# Patient Record
Sex: Male | Born: 1985 | Race: White | Hispanic: No | Marital: Married | Smoking: Current every day smoker
Health system: Southern US, Community
[De-identification: ages and names within clinical notes are randomized; demographics above are authoritative.]

## PROBLEM LIST (undated history)

## (undated) DIAGNOSIS — F909 Attention-deficit hyperactivity disorder, unspecified type: Secondary | ICD-10-CM

## (undated) HISTORY — DX: Attention-deficit hyperactivity disorder, unspecified type: F90.9

---
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
## 2005-01-09 ENCOUNTER — Ambulatory Visit: Payer: Self-pay | Admitting: Pediatrics

## 2005-04-26 ENCOUNTER — Emergency Department: Payer: Self-pay | Admitting: Emergency Medicine

## 2005-04-29 ENCOUNTER — Emergency Department: Payer: Self-pay | Admitting: Emergency Medicine

## 2005-05-16 ENCOUNTER — Emergency Department: Payer: Self-pay | Admitting: Emergency Medicine

## 2005-06-08 ENCOUNTER — Ambulatory Visit: Payer: Self-pay | Admitting: Pediatrics

## 2005-10-21 ENCOUNTER — Emergency Department: Payer: Self-pay | Admitting: Emergency Medicine

## 2006-02-20 ENCOUNTER — Emergency Department: Payer: Self-pay | Admitting: Unknown Physician Specialty

## 2006-02-23 ENCOUNTER — Emergency Department: Payer: Self-pay

## 2006-02-27 ENCOUNTER — Emergency Department: Payer: Self-pay | Admitting: Emergency Medicine

## 2006-04-14 ENCOUNTER — Emergency Department: Payer: Self-pay | Admitting: Emergency Medicine

## 2006-04-15 ENCOUNTER — Other Ambulatory Visit: Payer: Self-pay

## 2006-12-09 ENCOUNTER — Emergency Department: Payer: Self-pay | Admitting: Emergency Medicine

## 2007-01-16 ENCOUNTER — Emergency Department: Payer: Self-pay | Admitting: Emergency Medicine

## 2008-12-28 ENCOUNTER — Emergency Department: Payer: Self-pay | Admitting: Emergency Medicine

## 2009-07-18 ENCOUNTER — Emergency Department: Payer: Self-pay | Admitting: Emergency Medicine

## 2012-11-24 ENCOUNTER — Emergency Department: Payer: Self-pay | Admitting: Emergency Medicine

## 2012-11-24 LAB — BASIC METABOLIC PANEL
Anion Gap: 4 — ABNORMAL LOW (ref 7–16)
BUN: 8 mg/dL (ref 7–18)
EGFR (African American): >60
EGFR (Non-African Amer.): >60
Glucose: 81 mg/dL (ref 65–99)
Osmolality: 275 (ref 275–301)
Potassium: 3.8 mmol/L (ref 3.5–5.1)
Sodium: 139 mmol/L (ref 136–145)

## 2012-11-24 LAB — TROPONIN I: Troponin-I: <0.02 ng/mL

## 2012-11-24 LAB — URINALYSIS, COMPLETE
Hyaline Cast: 1
Leukocyte Esterase: NEGATIVE
Nitrite: NEGATIVE
Specific Gravity: 1.016 (ref 1.003–1.030)
Squamous Epithelial: NONE SEEN
WBC UR: <1 /HPF (ref 0–5)

## 2012-11-24 LAB — CBC
HCT: 47.1 % (ref 40.0–52.0)
HGB: 16 g/dL (ref 13.0–18.0)
MCH: 30.7 pg (ref 26.0–34.0)
Platelet: 159 10*3/uL (ref 150–440)
RBC: 5.22 10*6/uL (ref 4.40–5.90)
RDW: 13.2 % (ref 11.5–14.5)

## 2014-07-01 ENCOUNTER — Emergency Department: Payer: Self-pay | Admitting: Emergency Medicine

## 2014-07-01 LAB — CBC
HCT: 43.9 % (ref 40.0–52.0)
HGB: 14.8 g/dL (ref 13.0–18.0)
MCH: 31 pg (ref 26.0–34.0)
MCHC: 33.7 g/dL (ref 32.0–36.0)
MCV: 92 fL (ref 80–100)
PLATELETS: 146 10*3/uL — AB (ref 150–440)
RBC: 4.77 10*6/uL (ref 4.40–5.90)
RDW: 12.9 % (ref 11.5–14.5)
WBC: 7.7 10*3/uL (ref 3.8–10.6)

## 2014-07-01 LAB — TROPONIN I: Troponin-I: <0.02 ng/mL

## 2014-07-01 LAB — BASIC METABOLIC PANEL
Anion Gap: 8 (ref 7–16)
BUN: 6 mg/dL — AB (ref 7–18)
Calcium, Total: 8.7 mg/dL (ref 8.5–10.1)
Chloride: 106 mmol/L (ref 98–107)
Co2: 25 mmol/L (ref 21–32)
Creatinine: 0.8 mg/dL (ref 0.60–1.30)
EGFR (African American): >60
EGFR (Non-African Amer.): >60
Glucose: 97 mg/dL (ref 65–99)
Osmolality: 275 (ref 275–301)
Potassium: 3.7 mmol/L (ref 3.5–5.1)
Sodium: 139 mmol/L (ref 136–145)

## 2015-01-22 ENCOUNTER — Encounter: Payer: Self-pay | Admitting: Emergency Medicine

## 2015-01-22 ENCOUNTER — Emergency Department
Admission: EM | Admit: 2015-01-22 | Discharge: 2015-01-22 | Disposition: A | Discharge status: Home / Self Care | Payer: Self-pay | Attending: Emergency Medicine | Admitting: Emergency Medicine

## 2015-01-22 DIAGNOSIS — L551 Sunburn of second degree: Secondary | ICD-10-CM | POA: Insufficient documentation

## 2015-01-22 DIAGNOSIS — L559 Sunburn, unspecified: Secondary | ICD-10-CM

## 2015-01-22 DIAGNOSIS — Z720 Tobacco use: Secondary | ICD-10-CM | POA: Insufficient documentation

## 2015-01-22 MED ORDER — HYDROXYZINE HCL 25 MG PO TABS
25.0000 mg | ORAL_TABLET | Freq: Once | ORAL | Status: AC
  Administered 2015-01-22: 25 mg via ORAL

## 2015-01-22 MED ORDER — IBUPROFEN 800 MG PO TABS: 800.0000 mg | ORAL_TABLET | Freq: Three times a day (TID) | ORAL | Status: DC | PRN

## 2015-01-22 MED ORDER — SILVER SULFADIAZINE 1 % EX CREA: TOPICAL_CREAM | CUTANEOUS | Status: AC

## 2015-01-22 MED ORDER — HYDROXYZINE HCL 25 MG PO TABS
ORAL_TABLET | ORAL | Status: AC
  Filled 2015-01-22: qty 1

## 2015-01-22 MED ORDER — HYDROXYZINE HCL 25 MG PO TABS: 25.0000 mg | ORAL_TABLET | Freq: Three times a day (TID) | ORAL | Status: DC | PRN

## 2015-01-22 NOTE — ED Notes (Signed)
Pt presents to ED with sunburn to his ankles from being outside on Sunday. 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 Redness noted to both lower legs and ankles.

## 2015-01-22 NOTE — Discharge Instructions (Signed)
Take medication as prescribed. Use cold compresses as discussed to help with pain and discomfort, and have a barrier between compresses and skin such as a towel. Use mild soap and good moisturizing lotion. Always use sunscreen. Avoid direct sun exposure as able. Follow-up with her primary care physician or the above as needed.  Return to the ER for fever, vomiting, persistent nausea, new or worsening concerns.  Sunburn Sunburn is damage to the skin caused by overexposure to ultraviolet (UV) rays. People with light skin or a fair complexion may be more susceptible to sunburn. Repeated sun exposure causes early skin aging such as wrinkles and sun spots. It also increases the risk of skin cancer. CAUSES A sunburn is caused by getting too much UV radiation from the sun. SYMPTOMS  Red or pink skin.  Soreness and swelling.  Pain.  Blisters.  Peeling skin.  Headache, fever, and fatigue if sunburn covers a large area. TREATMENT  Your caregiver may tell you to take certain medicines to lessen inflammation.  Your caregiver may have you use hydrocortisone cream or spray to help with itching and inflammation.  Your caregiver may prescribe an antibiotic cream to use on blisters. HOME CARE INSTRUCTIONS   Avoid further exposure to the sun.  Cool baths and cool compresses may be helpful if used several times per day. Do not apply ice, since this may result in more damage to the skin.  Only take over-the-counter or prescription medicines for pain, discomfort, or fever as directed by your caregiver.  Use aloe or other over-the-counter sunburn creams or gels on your skin. Do not apply these creams or gels on blisters.  Drink enough fluids to keep your urine clear or pale yellow.  Do not break blisters. If blisters break, your caregiver may recommend an antibiotic cream to apply to the affected area. PREVENTION   Try to avoid the sun between 10:00 a.m. and 4:00 p.m. when it is the  strongest.  Apply sunscreen at least 30 minutes before exposure to the sun.  Always wear protective hats, clothing, and sunglasses with UV protection.  Avoid medicines, herbs, and foods that increase your sensitivity to sunlight.  Avoid tanning beds. SEEK IMMEDIATE MEDICAL CARE IF:   You have a fever.  Your pain is uncontrolled with medicine.  You start to vomit or have diarrhea.  You feel faint or develop a headache with confusion.  You develop severe blistering.  You have a pus-like (purulent) discharge coming from the blisters.  Your burn becomes more painful and swollen. MAKE SURE YOU:  Understand these instructions.  Will watch your condition.  Will get help right away if you are not doing well or get worse. Document Released: 04/29/2005 Document Revised: 11/14/2012 Document Reviewed: 01/11/2011 [facility] Patient Information 2015 [place], [place]. This information is not intended to replace advice given to you by your health care provider. Make sure you discuss any questions you have with your health care provider.

## 2015-01-22 NOTE — ED Provider Notes (Signed)
[facility] Emergency Department Provider Note  ____________________________________________  Time seen: Approximately 11:45 PM  I have reviewed the triage vital signs and the nursing notes.   HISTORY  Chief Complaint Sunburn   HPI Steven Berry is a 29 y.o. male presents to the ER for complaint of sunburn. Patient states that Sunday afternoon he laid outside in swim trunks "to get a tan" for approximate 2 hours. Patient states that that evening his skin felt warm and noticed that it was red. Patient states that the redness is only to the front of his body. Patient states that he is here tonight as the sunburn is very tender and states that it hurts on his feet to have to wear shoes. Patient states that he has to wear shoes while at work as he works at a 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  Denies nausea, vomiting, diarrhea, weakness, fever, decreased appetite or decreased by mouth intake. Reports continues to eat and drink well. States pain is a burning pain and rates it at 4 out of 10 to chest and lower legs. Denies other complaints. Denies other changes and foods, medicines, cracks, lotions, detergents or other chemicals.States skin itches where it is red.  Patient states that face and arms did not get burned while on this line as he is frequently exposed to sun to those areas, however states that his chest and legs are not normally exposed to the sun.   History reviewed. No pertinent past medical history.  There are no active problems to display for this patient.   History reviewed. No pertinent past surgical history.  No current outpatient prescriptions on file.  Allergies Review of patient's allergies indicates no known allergies.  No family history on file.  Social History History  Substance Use Topics  . Smoking status: Current Every Day Smoker -- 1.00 packs/day    Types:  Cigarettes  . Smokeless tobacco: Never Used  . Alcohol Use: Yes    Review of Systems Constitutional: No fever/chills Eyes: No visual changes. ENT: No sore throat. Cardiovascular: Denies chest pain. Respiratory: Denies shortness of breath. Gastrointestinal: No abdominal pain.  No nausea, no vomiting.  No diarrhea.  No constipation. Genitourinary: Negative for dysuria. Musculoskeletal: Negative for back pain. Skin: positive for redness and itching Neurological: Negative for headaches, focal weakness or numbness.  10-point ROS otherwise negative.  ____________________________________________   PHYSICAL EXAM:  VITAL SIGNS: ED Triage Vitals  Enc Vitals Group     BP 01/22/15 2252 130/73 mmHg     Pulse Rate 01/22/15 2252 84     Resp 01/22/15 2252 20     Temp 01/22/15 2254 97.8 F (36.6 C)     Temp Source 01/22/15 2252 Oral     SpO2 01/22/15 2252 100 %     Weight 01/22/15 2252 165 lb (74.844 kg)     Height 01/22/15 2252 6' (1.829 m)     Head Cir --      Peak Flow --      Pain Score 01/22/15 2252 8     Pain Loc --      Pain Edu? --      Excl. in GC? --     Constitutional: Alert and oriented. Well appearing and in no acute distress. Eyes: Conjunctivae are normal. PERRL. EOMI. Head: Atraumatic. Nose: No congestion/rhinnorhea. Mouth/Throat: Mucous membranes are moist.  Oropharynx non-erythematous. Neck: No stridor.  No cervical spine tenderness  to palpation. Hematological/Lymphatic/Immunilogical: No cervical lymphadenopathy. Cardiovascular: Normal rate, regular rhythm. Grossly normal heart sounds.  Good peripheral circulation. Respiratory: Normal respiratory effort.  No retractions. Lungs CTAB. Gastrointestinal: Soft and nontender. No distention. No abdominal bruits. No CVA tenderness. Musculoskeletal: No lower extremity tenderness nor edema.  No joint effusions. Neurologic:  Normal speech and language. No gross focal neurologic deficits are appreciated. Speech is normal.  No gait instability. Skin:  Skin is warm, dry and intact.   Pruritic erythema to chest, bilateral lower extremities and dorsal feet with some dryness and scaling.  Erythema is blanchable throughout. Mild pruritis and minimal erythema to bilateral arms. No induration or fluctuance. Erythema only to anterior body surface. Erythema present from clothing waist line up chest, as well as marked skin coloration difference on upper thighs consistent with shorts line: with erythema below mid thigh and none above where shorts were present. Erythema pattern spares where clothing was present.   Bilateral dorsal feet mild to mod TTP and each with one less than 0.5cm small fluid filled blister present. No pitting edema. Bilateral pedal pulses equal and easily found. Steady gait. No tenderness to palpation above ankles. Changes positions quickly without distress. Skin otherwise intact. Psychiatric: Mood and affect are normal. Speech and behavior are normal.  ____________________________________________   LABS (all labs ordered are listed, but only abnormal results are displayed)  Labs Reviewed - No data to display ____________________________________________   INITIAL IMPRESSION / ASSESSMENT AND PLAN / ED COURSE  Pertinent labs & imaging results that were available during my care of the patient were reviewed by me and considered in my medical decision making (see chart for details).  No acute distress. Very well-appearing patient. Presents the ER for the complaint of sunburn. Patient states that he laid out in the sun for approximately 2 hours on Sunday which caused the sunburn to appear within a few hours. Patient states that it is very tender to his feet and presents tonight as he needs a work note because it hurts to wear shoes at work at night.   Patient with generalized erythema to anterior body which spares areas where clothing was present consistent with sunburn. Bilateral dorsal feet with less than  0.5 cm intact blister with tenderness. Erythema blanchable. Patient well-appearing. He continues to eat and drink well per patient. Moist mucous membranes. Will treat patient with hydroxyzine as needed for itching, ibuprofen anti-inflammatory as well as Silvadene topical cream to be applied to bilateral ankles and feet. Discussed use of sun protection as well as sunscreen. Discussed hydration and skin moisturizing. Patient verbalized understanding. Discussed follow-up and return parameters. ____________________________________________   FINAL CLINICAL IMPRESSION(S) / ED DIAGNOSES  Final diagnoses:  Burn from the sun      Renford Dills, NP 01/23/15 0013  Myrna Blazer, MD 01/29/15 [phone]

## 2015-01-22 NOTE — ED Notes (Signed)
Patient with bilateral leg sunburn Sunday. Patient with some swelling to bilateral ankles.

## 2015-11-25 ENCOUNTER — Emergency Department
Admission: EM | Admit: 2015-11-25 | Discharge: 2015-11-25 | Disposition: A | Discharge status: Home / Self Care | Payer: Managed Care, Other (non HMO) | Attending: Emergency Medicine | Admitting: Emergency Medicine

## 2015-11-25 DIAGNOSIS — Z79899 Other long term (current) drug therapy: Secondary | ICD-10-CM | POA: Insufficient documentation

## 2015-11-25 DIAGNOSIS — Z791 Long term (current) use of non-steroidal anti-inflammatories (NSAID): Secondary | ICD-10-CM | POA: Diagnosis not present

## 2015-11-25 DIAGNOSIS — M546 Pain in thoracic spine: Secondary | ICD-10-CM | POA: Diagnosis present

## 2015-11-25 DIAGNOSIS — F17210 Nicotine dependence, cigarettes, uncomplicated: Secondary | ICD-10-CM | POA: Diagnosis not present

## 2015-11-25 DIAGNOSIS — M62830 Muscle spasm of back: Secondary | ICD-10-CM | POA: Diagnosis not present

## 2015-11-25 MED ORDER — DIAZEPAM 5 MG/ML IJ SOLN
INTRAMUSCULAR | Status: AC
  Filled 2015-11-25: qty 2

## 2015-11-25 MED ORDER — TRAMADOL HCL 50 MG PO TABS: 50.0000 mg | ORAL_TABLET | Freq: Four times a day (QID) | ORAL | Status: DC | PRN

## 2015-11-25 MED ORDER — DIAZEPAM 5 MG PO TABS: 5.0000 mg | ORAL_TABLET | Freq: Three times a day (TID) | ORAL | Status: DC | PRN

## 2015-11-25 MED ORDER — DIAZEPAM 5 MG/ML IJ SOLN
5.0000 mg | Freq: Once | INTRAMUSCULAR | Status: AC
  Administered 2015-11-25: 5 mg via INTRAMUSCULAR

## 2015-11-25 MED ORDER — KETOROLAC TROMETHAMINE 30 MG/ML IJ SOLN: 30.0000 mg | Freq: Once | INTRAMUSCULAR | Status: DC

## 2015-11-25 MED ORDER — KETOROLAC TROMETHAMINE 30 MG/ML IJ SOLN
INTRAMUSCULAR | Status: AC
  Filled 2015-11-25: qty 1

## 2015-11-25 MED ORDER — KETOROLAC TROMETHAMINE 30 MG/ML IJ SOLN
30.0000 mg | Freq: Once | INTRAMUSCULAR | Status: AC
  Administered 2015-11-25: 30 mg via INTRAMUSCULAR

## 2015-11-25 NOTE — ED Provider Notes (Signed)
CSN: [id number]     Arrival date & time 11/25/15  1731 History   First MD Initiated Contact with Patient 11/25/15 1750     Chief Complaint  Patient presents with  . Back Pain     (Consider location/radiation/quality/duration/timing/severity/associated sxs/prior Treatment) HPI  30 year old male presents to emergency department tonight for evaluation of mid left-sided thoracic back pain. 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 This morning noticed tightness and spasming in the left thoracic spine. Spasming is been persistent since and causing moderate 8 out of 10 pain. Isn't taking medications for pain relief. Denies any numbness or tingling. No trauma. Pain does not radiate. He denies any abdominal pain, chest pain, urinary symptoms.  History reviewed. No pertinent past medical history. History reviewed. No pertinent past surgical history. No family history on file. Social History  Substance Use Topics  . Smoking status: Current Every Day Smoker -- 1.00 packs/day    Types: Cigarettes  . Smokeless tobacco: Never Used  . Alcohol Use: Yes    Review of Systems  Constitutional: Negative.  Negative for fever, chills, activity change and appetite change.  HENT: Negative for congestion, ear pain, mouth sores, rhinorrhea, sinus pressure, sore throat and trouble swallowing.   Eyes: Negative for photophobia, pain and discharge.  Respiratory: Negative for cough, chest tightness and shortness of breath.   Cardiovascular: Negative for chest pain and leg swelling.  Gastrointestinal: Negative for nausea, vomiting, abdominal pain, diarrhea and abdominal distention.  Genitourinary: Negative for dysuria and difficulty urinating.  Musculoskeletal: Positive for myalgias. Negative for back pain, arthralgias and gait problem.  Skin: Negative for color change and rash.  Neurological: Negative for dizziness and headaches.  Hematological: Negative for adenopathy.  Psychiatric/Behavioral:  Negative for behavioral problems and agitation.      Allergies  Penicillins  Home Medications   Prior to Admission medications   Medication Sig Start Date End Date Taking? Authorizing Provider  diazepam (VALIUM) 5 MG tablet Take 1 tablet (5 mg total) by mouth every 8 (eight) hours as needed for muscle spasms. 11/25/15 11/24/16  Evon Slack, PA-C  hydrOXYzine (ATARAX/VISTARIL) 25 MG tablet Take 1 tablet (25 mg total) by mouth 3 (three) times daily as needed for itching. 01/22/15   Renford Dills, NP  ibuprofen (ADVIL,MOTRIN) 800 MG tablet Take 1 tablet (800 mg total) by mouth every 8 (eight) hours as needed for mild pain or moderate pain. 01/22/15   Renford Dills, NP  silver sulfADIAZINE (SILVADENE) 1 % cream Apply to lower legs and feet twice a day for 10 days 01/22/15 01/22/16  Renford Dills, NP  traMADol (ULTRAM) 50 MG tablet Take 1 tablet (50 mg total) by mouth every 6 (six) hours as needed. 11/25/15   Evon Slack, PA-C   BP 130/52 mmHg  Pulse 76  Temp(Src) 98.2 F (36.8 C) (Oral)  Resp 18  Ht 6' (1.829 m)  Wt 74.844 kg  BMI 22.37 kg/m2  SpO2 98% Physical Exam  Constitutional: He is oriented to person, place, and time. He appears well-developed and well-nourished.  HENT:  Head: Normocephalic and atraumatic.  Eyes: Conjunctivae and EOM are normal. Pupils are equal, round, and reactive to light.  Neck: Normal range of motion. Neck supple.  Cardiovascular: Normal rate, regular rhythm, normal heart sounds and intact distal pulses.   Pulmonary/Chest: Effort normal and breath sounds normal. No respiratory distress. He has no wheezes. He has no rales. He exhibits no tenderness.  Abdominal: Soft. Bowel sounds are normal.  He exhibits no distension. There is no tenderness. There is no rebound and no guarding.  Musculoskeletal:  Examination of the cervical thoracic lumbar spine shows patient has no spinous process tenderness. There is no paravertebral muscle tenderness along the  cervical or lumbar spine. Patient has moderate tenderness along the left paravertebral muscles of the thoracic spine along the inferior scapular border. Muscle spasms are present. He has full range of motion of the upper and lower extremities.  Neurological: He is alert and oriented to person, place, and time.  Skin: Skin is warm and dry.  Psychiatric: He has a normal mood and affect. His behavior is normal. Judgment and thought content normal.    ED Course  Procedures (including critical care time) Labs Review Labs Reviewed - No data to display  Imaging Review No results found. I have personally reviewed and evaluated these images and lab results as part of my medical decision-making.   EKG Interpretation None      MDM   Final diagnoses:  Muscle spasm of back  30 year old male with left thoracic paravertebral muscle spasms. No spinous process tenderness. No trauma or injury. No weakness or neurological deficits. Patient was given Valium 5 mg IM, 30 mg of Toradol IM. Patient saw significant improvement of muscle spasms. He will work on stretching, massaging. He will take ibuprofen, tramadol, Valium as needed for severe pain. Follow-up with orthopedics if no improvement.    Evon Slackhomas C Allona Gondek, PA-C 11/25/15 1938  Rockne MenghiniAnne-Caroline Norman, MD 11/25/15 [phone]

## 2015-11-25 NOTE — Discharge Instructions (Signed)

## 2015-11-25 NOTE — ED Notes (Signed)
Pt c/o mid left back pain since this morning, worse with movement, breathing.. States he does do heavy lifting

## 2016-01-13 ENCOUNTER — Emergency Department: Payer: No Typology Code available for payment source

## 2016-01-13 ENCOUNTER — Encounter: Payer: Self-pay | Admitting: Emergency Medicine

## 2016-01-13 ENCOUNTER — Emergency Department
Admission: EM | Admit: 2016-01-13 | Discharge: 2016-01-13 | Disposition: A | Discharge status: Home / Self Care | Payer: No Typology Code available for payment source | Attending: Emergency Medicine | Admitting: Emergency Medicine

## 2016-01-13 DIAGNOSIS — Z791 Long term (current) use of non-steroidal anti-inflammatories (NSAID): Secondary | ICD-10-CM | POA: Diagnosis not present

## 2016-01-13 DIAGNOSIS — Y9389 Activity, other specified: Secondary | ICD-10-CM | POA: Diagnosis not present

## 2016-01-13 DIAGNOSIS — S161XXA Strain of muscle, fascia and tendon at neck level, initial encounter: Secondary | ICD-10-CM | POA: Diagnosis not present

## 2016-01-13 DIAGNOSIS — S199XXA Unspecified injury of neck, initial encounter: Secondary | ICD-10-CM | POA: Diagnosis present

## 2016-01-13 DIAGNOSIS — Y92410 Unspecified street and highway as the place of occurrence of the external cause: Secondary | ICD-10-CM | POA: Diagnosis not present

## 2016-01-13 DIAGNOSIS — Y999 Unspecified external cause status: Secondary | ICD-10-CM | POA: Diagnosis not present

## 2016-01-13 MED ORDER — NAPROXEN 500 MG PO TABS: 500.0000 mg | ORAL_TABLET | Freq: Two times a day (BID) | ORAL | Status: DC

## 2016-01-13 MED ORDER — TRAMADOL HCL 50 MG PO TABS: 50.0000 mg | ORAL_TABLET | Freq: Four times a day (QID) | ORAL | Status: DC | PRN
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
## 2016-01-13 NOTE — Discharge Instructions (Signed)
Cervical Sprain  A cervical sprain is an injury in the neck in which the strong, fibrous tissues (ligaments) that connect your neck bones stretch or tear. Cervical sprains can range from mild to severe. Severe cervical sprains can cause the neck vertebrae to be unstable. This can lead to damage of the spinal cord and can result in serious nervous system problems. The amount of time it takes for a cervical sprain to get better depends on the cause and extent of the injury. Most cervical sprains heal in 1 to 3 weeks.  CAUSES   Severe cervical sprains may be caused by:    Contact sport injuries (such as from football, rugby, wrestling, hockey, auto racing, gymnastics, diving, martial arts, or boxing).    Motor vehicle collisions.    Whiplash injuries. This is an injury from a sudden forward and backward whipping movement of the head and neck.   Falls.   Mild cervical sprains may be caused by:    Being in an awkward position, such as while cradling a telephone between your ear and shoulder.    Sitting in a chair that does not offer proper support.    Working at a poorly designed computer station.    Looking up or down for long periods of time.   SYMPTOMS    Pain, soreness, stiffness, or a burning sensation in the front, back, or sides of the neck. This discomfort may develop immediately after the injury or slowly, 24 hours or more after the injury.    Pain or tenderness directly in the middle of the back of the neck.    Shoulder or upper back pain.    Limited ability to move the neck.    Headache.    Dizziness.    Weakness, numbness, or tingling in the hands or arms.    Muscle spasms.    Difficulty swallowing or chewing.    Tenderness and swelling of the neck.   DIAGNOSIS   Most of the time your health care provider can diagnose a cervical sprain by taking your history and doing a physical exam. Your health care provider will ask about previous neck injuries and any known neck  problems, such as arthritis in the neck. X-rays may be taken to find out if there are any other problems, such as with the bones of the neck. Other tests, such as a CT scan or MRI, may also be needed.   TREATMENT   Treatment depends on the severity of the cervical sprain. Mild sprains can be treated with rest, keeping the neck in place (immobilization), and pain medicines. Severe cervical sprains are immediately immobilized. Further treatment is done to help with pain, muscle spasms, and other symptoms and may include:   Medicines, such as pain relievers, numbing medicines, or muscle relaxants.    Physical therapy. This may involve stretching exercises, strengthening exercises, and posture training. Exercises and improved posture can help stabilize the neck, strengthen muscles, and help stop symptoms from returning.   HOME CARE INSTRUCTIONS    Put ice on the injured area.     Put ice in a plastic bag.     Place a towel between your skin and the bag.     Leave the ice on for 15-20 minutes, 3-4 times a day.    If your injury was severe, you may have been given a cervical collar to wear. A cervical collar is a two-piece collar designed to keep your neck from moving while it heals.      Do not remove the collar unless instructed by your health care provider.    If you have long hair, keep it outside of the collar.    Ask your health care provider before making any adjustments to your collar. Minor adjustments may be required over time to improve comfort and reduce pressure on your chin or on the back of your head.    Ifyou are allowed to remove the collar for cleaning or bathing, follow your health care provider's instructions on how to do so safely.    Keep your collar clean by wiping it with mild soap and water and drying it completely. If the collar you have been given includes removable pads, remove them every 1-2 days and hand wash them with soap and water. Allow them to air dry. They should be completely  dry before you wear them in the collar.    If you are allowed to remove the collar for cleaning and bathing, wash and dry the skin of your neck. Check your skin for irritation or sores. If you see any, tell your health care provider.    Do not drive while wearing the collar.    Only take over-the-counter or prescription medicines for pain, discomfort, or fever as directed by your health care provider.    Keep all follow-up appointments as directed by your health care provider.    Keep all physical therapy appointments as directed by your health care provider.    Make any needed adjustments to your workstation to promote good posture.    Avoid positions and activities that make your symptoms worse.    Warm up and stretch before being active to help prevent problems.   SEEK MEDICAL CARE IF:    Your pain is not controlled with medicine.    You are unable to decrease your pain medicine over time as planned.    Your activity level is not improving as expected.   SEEK IMMEDIATE MEDICAL CARE IF:    You develop any bleeding.   You develop stomach upset.   You have signs of an allergic reaction to your medicine.    Your symptoms get worse.    You develop new, unexplained symptoms.    You have numbness, tingling, weakness, or paralysis in any part of your body.   MAKE SURE YOU:    Understand these instructions.   Will watch your condition.   Will get help right away if you are not doing well or get worse.     This information is not intended to replace advice given to you by your health care provider. Make sure you discuss any questions you have with your health care provider.     Document Released: 05/17/2007 Document Revised: 07/25/2013 Document Reviewed: 01/25/2013  Elsevier Interactive Patient Education 2016 [employer]
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
## 2016-10-22 ENCOUNTER — Encounter: Payer: Self-pay | Admitting: Emergency Medicine

## 2016-10-22 ENCOUNTER — Emergency Department
Admission: EM | Admit: 2016-10-22 | Discharge: 2016-10-22 | Disposition: A | Discharge status: Home / Self Care | Payer: Managed Care, Other (non HMO) | Attending: Emergency Medicine | Admitting: Emergency Medicine

## 2016-10-22 ENCOUNTER — Emergency Department: Payer: Managed Care, Other (non HMO)

## 2016-10-22 DIAGNOSIS — S8002XA Contusion of left knee, initial encounter: Secondary | ICD-10-CM | POA: Insufficient documentation

## 2016-10-22 DIAGNOSIS — Y92410 Unspecified street and highway as the place of occurrence of the external cause: Secondary | ICD-10-CM | POA: Insufficient documentation

## 2016-10-22 DIAGNOSIS — Y9389 Activity, other specified: Secondary | ICD-10-CM | POA: Insufficient documentation

## 2016-10-22 DIAGNOSIS — F17210 Nicotine dependence, cigarettes, uncomplicated: Secondary | ICD-10-CM | POA: Insufficient documentation

## 2016-10-22 DIAGNOSIS — Y999 Unspecified external cause status: Secondary | ICD-10-CM | POA: Insufficient documentation

## 2016-10-22 MED ORDER — OXYCODONE-ACETAMINOPHEN 5-325 MG PO TABS
1.0000 | ORAL_TABLET | Freq: Once | ORAL | Status: AC
Start: 2016-10-22 — End: 2016-10-22
  Administered 2016-10-22: 1 via ORAL
  Filled 2016-10-22: qty 1

## 2016-10-22 MED ORDER — OXYCODONE-ACETAMINOPHEN 5-325 MG PO TABS: 1.0000 | ORAL_TABLET | ORAL | 0 refills | Status: DC | PRN
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
## 2016-10-22 NOTE — ED Notes (Signed)

## 2016-10-22 NOTE — Discharge Instructions (Signed)
Follow up with Dr. Hyacinth Meeker if any continued problems with your knee. Percocet as needed for pain.  Ice and elevate, wear brace for support.  Wear brace for approximately a week

## 2017-03-09 ENCOUNTER — Encounter: Payer: Self-pay | Admitting: Emergency Medicine

## 2017-03-09 ENCOUNTER — Emergency Department
Admission: EM | Admit: 2017-03-09 | Discharge: 2017-03-09 | Disposition: A | Discharge status: Home / Self Care | Payer: Managed Care, Other (non HMO) | Attending: Emergency Medicine | Admitting: Emergency Medicine

## 2017-03-09 DIAGNOSIS — Z5321 Procedure and treatment not carried out due to patient leaving prior to being seen by health care provider: Secondary | ICD-10-CM | POA: Insufficient documentation

## 2017-03-09 DIAGNOSIS — R1031 Right lower quadrant pain: Secondary | ICD-10-CM | POA: Insufficient documentation

## 2017-03-09 LAB — URINALYSIS, COMPLETE (UACMP) WITH MICROSCOPIC
Bilirubin Urine: NEGATIVE
Glucose, UA: NEGATIVE mg/dL
Hgb urine dipstick: NEGATIVE
Ketones, ur: NEGATIVE mg/dL
Leukocytes, UA: NEGATIVE
Nitrite: NEGATIVE
PH: 6 (ref 5.0–8.0)
Protein, ur: NEGATIVE mg/dL
SPECIFIC GRAVITY, URINE: 1.019 (ref 1.005–1.030)
Squamous Epithelial / LPF: NONE SEEN

## 2017-03-09 NOTE — ED Notes (Signed)
No answer when called from lobby 

## 2017-03-09 NOTE — ED Triage Notes (Signed)
Patient ambulatory to triage with steady gait, without difficulty or distress noted; pt reports right sided back pain, nonradiating since this morning with no accomp symptoms; denies hx of same

## 2017-11-01 ENCOUNTER — Other Ambulatory Visit: Payer: Self-pay

## 2017-11-01 ENCOUNTER — Encounter: Payer: Self-pay | Admitting: Emergency Medicine

## 2017-11-01 ENCOUNTER — Emergency Department
Admission: EM | Admit: 2017-11-01 | Discharge: 2017-11-01 | Disposition: A | Discharge status: Home / Self Care | Payer: Managed Care, Other (non HMO) | Attending: Emergency Medicine | Admitting: Emergency Medicine

## 2017-11-01 DIAGNOSIS — M545 Low back pain: Secondary | ICD-10-CM

## 2017-11-01 DIAGNOSIS — Z79899 Other long term (current) drug therapy: Secondary | ICD-10-CM | POA: Insufficient documentation

## 2017-11-01 DIAGNOSIS — M5416 Radiculopathy, lumbar region: Secondary | ICD-10-CM | POA: Insufficient documentation

## 2017-11-01 DIAGNOSIS — F17210 Nicotine dependence, cigarettes, uncomplicated: Secondary | ICD-10-CM | POA: Insufficient documentation

## 2017-11-01 MED ORDER — CYCLOBENZAPRINE HCL 5 MG PO TABS: ORAL_TABLET | ORAL | 0 refills | Status: DC

## 2017-11-01 MED ORDER — KETOROLAC TROMETHAMINE 10 MG PO TABS: 10.0000 mg | ORAL_TABLET | Freq: Four times a day (QID) | ORAL | 0 refills | Status: DC | PRN

## 2017-11-01 MED ORDER — PREDNISONE 10 MG PO TABS: ORAL_TABLET | ORAL | 0 refills | Status: DC

## 2017-11-01 MED ORDER — LIDOCAINE 5 % EX PTCH: 1.0000 | MEDICATED_PATCH | Freq: Two times a day (BID) | CUTANEOUS | 0 refills | Status: AC

## 2017-11-01 MED ORDER — KETOROLAC TROMETHAMINE 30 MG/ML IJ SOLN
30.0000 mg | Freq: Once | INTRAMUSCULAR | Status: AC
  Administered 2017-11-01: 30 mg via INTRAMUSCULAR
  Filled 2017-11-01: qty 1

## 2017-11-01 MED ORDER — OXYCODONE-ACETAMINOPHEN 5-325 MG PO TABS
1.0000 | ORAL_TABLET | Freq: Once | ORAL | Status: AC
  Administered 2017-11-01: 1 via ORAL
  Filled 2017-11-01: qty 1

## 2017-11-01 MED ORDER — LIDOCAINE 5 % EX PTCH
1.0000 | MEDICATED_PATCH | CUTANEOUS | Status: DC
  Administered 2017-11-01: 1 via TRANSDERMAL
  Filled 2017-11-01: qty 1

## 2017-11-01 NOTE — ED Provider Notes (Signed)
[facility] Emergency Department Provider Note  ____________________________________________  Time seen: Approximately 10:13 AM  I have reviewed the triage vital signs and the nursing notes.   HISTORY  Chief Complaint Back Pain    HPI Steven Berry is a 32 y.o. male that presents to the emergency department for evaluation of back pain for 2-3 months that has worsened today.  Patient occasionally gets shooting pains with numbness and tingling down the outside of his right leg.  Pain improves when he lays on his side and worsens when he lays on his back.  He has taken ibuprofen, without relief.  No bowel or bladder dysfunction or saddle paresthesias.  He lives between 10 and 50 pounds at work every day.  No specific trauma. He denies fever, chills, nausea, vomiting, abdominal pain, dysuria, hematuria.   History reviewed. No pertinent past medical history.  There are no active problems to display for this patient.   History reviewed. No pertinent surgical history.  Prior to Admission medications   Medication Sig Start Date End Date Taking? Authorizing Provider  cyclobenzaprine (FLEXERIL) 5 MG tablet Take 1-2 tablets 3 times daily as needed 11/01/17   Enid Derry, PA-C  ketorolac (TORADOL) 10 MG tablet Take 1 tablet (10 mg total) by mouth every 6 (six) hours as needed. 11/01/17   Enid Derry, PA-C  lidocaine (LIDODERM) 5 % Place 1 patch onto the skin every 12 (twelve) hours. Remove & Discard patch within 12 hours or as directed by MD 11/01/17 11/01/18  Enid Derry, PA-C  oxyCODONE-acetaminophen (PERCOCET) 5-325 MG tablet Take 1 tablet by mouth every 4 (four) hours as needed for severe pain. 10/22/16   Tommi Rumps, PA-C  predniSONE (DELTASONE) 10 MG tablet Take 6 tablets on day 1, take 5 tablets on day 2, take 4 tablets on day 3, take 3 tablets on day 4, take 2 tablets on day 5, take 1 tablet on day 6 11/01/17   Enid Derry, PA-C     Allergies Penicillins  No family history on file.  Social History Social History   Tobacco Use  . Smoking status: Current Every Day Smoker    Packs/day: 1.00    Types: Cigarettes  . Smokeless tobacco: Never Used  Substance Use Topics  . Alcohol use: No  . Drug use: No     Review of Systems  Constitutional: No fever/chills Cardiovascular: No chest pain. Respiratory:  No SOB. Gastrointestinal: No abdominal pain.  No nausea, no vomiting.  Genitourinary: Negative for dysuria. Musculoskeletal: Positive for back pain.  Skin: Negative for rash, abrasions, lacerations, ecchymosis.   ____________________________________________   PHYSICAL EXAM:  VITAL SIGNS: ED Triage Vitals  Enc Vitals Group     BP 11/01/17 0930 121/80     Pulse Rate 11/01/17 0930 79     Resp 11/01/17 0930 16     Temp 11/01/17 0930 (!) 97.5 F (36.4 C)     Temp Source 11/01/17 0930 Oral     SpO2 11/01/17 0930 100 %     Weight 11/01/17 0927 162 lb (73.5 kg)     Height 11/01/17 0927 6' (1.829 m)     Head Circumference --      Peak Flow --      Pain Score 11/01/17 0927 8     Pain Loc --      Pain Edu? --      Excl. in GC? --      Constitutional: Alert and oriented. Well appearing and in  no acute distress. Eyes: Conjunctivae are normal. PERRL. EOMI. Head: Atraumatic. ENT:      Ears:      Nose: No congestion/rhinnorhea.      Mouth/Throat: Mucous membranes are moist.  Neck: No stridor. Cardiovascular: Normal rate, regular rhythm.  Good peripheral circulation. Respiratory: Normal respiratory effort without tachypnea or retractions. Lungs CTAB. Good air entry to the bases with no decreased or absent breath sounds. Gastrointestinal: Bowel sounds 4 quadrants. Soft and nontender to palpation. No guarding or rigidity. No palpable masses. No distention. No CVA tenderness. Musculoskeletal: Full range of motion to all extremities. No gross deformities appreciated.  Mild tenderness to palpation over  superior lumbar spine and left paraspinal muscles.  Strength equal in lower extremity's bilaterally.  Normal gait. Neurologic:  Normal speech and language. No gross focal neurologic deficits are appreciated.  Skin:  Skin is warm, dry and intact. No rash noted.   ____________________________________________   LABS (all labs ordered are listed, but only abnormal results are displayed)  Labs Reviewed - No data to display ____________________________________________  EKG   ____________________________________________  RADIOLOGY  No results found.  ____________________________________________    PROCEDURES  Procedure(s) performed:    Procedures    Medications  lidocaine (LIDODERM) 5 % 1 patch (1 patch Transdermal Patch Applied 11/01/17 1039)  ketorolac (TORADOL) 30 MG/ML injection 30 mg (30 mg Intramuscular Given 11/01/17 1038)  oxyCODONE-acetaminophen (PERCOCET/ROXICET) 5-325 MG per tablet 1 tablet (1 tablet Oral Given 11/01/17 1038)     ____________________________________________   INITIAL IMPRESSION / ASSESSMENT AND PLAN / ED COURSE  Pertinent labs & imaging results that were available during my care of the patient were reviewed by me and considered in my medical decision making (see chart for details).  Review of the [place] CSRS was performed in accordance of the [facility] prior to dispensing any controlled drugs.   Patient presents to the emergency department for evaluation of low back pain.  Symptoms are consistent with musculoskeletal pain and radiculopathy.  No trauma.  Patient is up walking comfortably in the ED.  Patient  does heavy lifting at work.  X-ray was offered and patient declined.  No bowel or bladder dysfunction or saddle paresthesias.  Patient will be discharged home with prescriptions for Toradol, Flexeril, Lidoderm, prednisone. Patient is to follow up with PCP as directed. Patient is given ED precautions to return to the ED for any worsening or new  symptoms.     ____________________________________________  FINAL CLINICAL IMPRESSION(S) / ED DIAGNOSES  Final diagnoses:  Lumbar radiculopathy  Acute right-sided low back pain, with sciatica presence unspecified      NEW MEDICATIONS STARTED DURING THIS VISIT:  ED Discharge Orders        Ordered    ketorolac (TORADOL) 10 MG tablet  Every 6 hours PRN     11/01/17 1045    lidocaine (LIDODERM) 5 %  Every 12 hours     11/01/17 1045    cyclobenzaprine (FLEXERIL) 5 MG tablet     11/01/17 1045    predniSONE (DELTASONE) 10 MG tablet     11/01/17 1045          This chart was dictated using voice recognition software/Dragon. Despite best efforts to proofread, errors can occur which can change the meaning. Any change was purely unintentional.    Enid DerryWagner, Jashad Depaula, PA-C 11/01/17 1251    Emily FilbertWilliams, Jonathan E, MD 11/01/17 1425

## 2017-11-01 NOTE — ED Notes (Signed)
See triage note.  States he has had right lower back pain.for the past 3 months  States pain was worse in the mornings but eased off  Then for the past 2 weeks pain has gotten worse pain is moving into right leg  Ambulates well to treatment room

## 2017-11-01 NOTE — ED Triage Notes (Signed)
C/O right lower back pain x 3 months. States pain radiates down right leg intermittently.

## 2017-11-03 ENCOUNTER — Other Ambulatory Visit: Payer: Self-pay

## 2017-11-03 ENCOUNTER — Emergency Department
Admission: EM | Admit: 2017-11-03 | Discharge: 2017-11-03 | Disposition: A | Discharge status: Home / Self Care | Payer: Self-pay | Attending: Emergency Medicine | Admitting: Emergency Medicine

## 2017-11-03 ENCOUNTER — Encounter: Payer: Self-pay | Admitting: Emergency Medicine

## 2017-11-03 DIAGNOSIS — M5416 Radiculopathy, lumbar region: Secondary | ICD-10-CM | POA: Insufficient documentation

## 2017-11-03 DIAGNOSIS — M5410 Radiculopathy, site unspecified: Secondary | ICD-10-CM

## 2017-11-03 DIAGNOSIS — F17210 Nicotine dependence, cigarettes, uncomplicated: Secondary | ICD-10-CM | POA: Insufficient documentation

## 2017-11-03 DIAGNOSIS — Z0279 Encounter for issue of other medical certificate: Secondary | ICD-10-CM | POA: Insufficient documentation

## 2017-11-03 NOTE — Discharge Instructions (Addendum)
Radicular back pain resolved.  Patient may return back to full work status.

## 2017-11-03 NOTE — ED Notes (Signed)
Pt seen here Monday for back pain and need to be released for full duty for pt to return to work. Pt states he currently has no pain and feel he can return to work.

## 2017-11-03 NOTE — ED Provider Notes (Signed)
[facility] Emergency Department Provider Note   ____________________________________________   First MD Initiated Contact with Patient 11/03/17 0845     (approximate)  I have reviewed the triage vital signs and the nursing notes.   HISTORY  Chief Complaint Follow-up    HPI Steven Berry is a 32 y.o. male patient here today for recheck to return back to work.  Patient was placed on limited work restrictions from his previous visit.  Job does not except restrictions and patient has not worked since his last visit.  Patient state back pain is resolved and he needs a clearance to return to work.  History reviewed. No pertinent past medical history.  There are no active problems to display for this patient.   History reviewed. No pertinent surgical history.  Prior to Admission medications   Medication Sig Start Date End Date Taking? Authorizing Provider  cyclobenzaprine (FLEXERIL) 5 MG tablet Take 1-2 tablets 3 times daily as needed 11/01/17   Enid Derry, PA-C  ketorolac (TORADOL) 10 MG tablet Take 1 tablet (10 mg total) by mouth every 6 (six) hours as needed. 11/01/17   Enid Derry, PA-C  lidocaine (LIDODERM) 5 % Place 1 patch onto the skin every 12 (twelve) hours. Remove & Discard patch within 12 hours or as directed by MD 11/01/17 11/01/18  Enid Derry, PA-C  oxyCODONE-acetaminophen (PERCOCET) 5-325 MG tablet Take 1 tablet by mouth every 4 (four) hours as needed for severe pain. 10/22/16   Tommi Rumps, PA-C  predniSONE (DELTASONE) 10 MG tablet Take 6 tablets on day 1, take 5 tablets on day 2, take 4 tablets on day 3, take 3 tablets on day 4, take 2 tablets on day 5, take 1 tablet on day 6 11/01/17   Enid Derry, PA-C    Allergies Penicillins  No family history on file.  Social History Social History   Tobacco Use  . Smoking status: Current Every Day Smoker    Packs/day: 1.00    Types: Cigarettes  . Smokeless tobacco: Never Used    Substance Use Topics  . Alcohol use: No  . Drug use: No    Review of Systems Constitutional: No fever/chills Eyes: No visual changes. ENT: No sore throat. Cardiovascular: Denies chest pain. Respiratory: Denies shortness of breath. Gastrointestinal: No abdominal pain.  No nausea, no vomiting.  No diarrhea.  No constipation. Genitourinary: Negative for dysuria. Musculoskeletal: Negative for back pain. Skin: Negative for rash. Neurological: Negative for headaches, focal weakness or numbness.   ____________________________________________   PHYSICAL EXAM:  VITAL SIGNS: ED Triage Vitals  Enc Vitals Group     BP 11/03/17 0831 136/71     Pulse Rate 11/03/17 0831 90     Resp 11/03/17 0831 18     Temp 11/03/17 0831 98.2 F (36.8 C)     Temp Source 11/03/17 0831 Oral     SpO2 11/03/17 0831 98 %     Weight 11/03/17 0828 165 lb (74.8 kg)     Height 11/03/17 0828 6' (1.829 m)     Head Circumference --      Peak Flow --      Pain Score 11/03/17 0841 0     Pain Loc --      Pain Edu? --      Excl. in GC? --    Constitutional: Alert and oriented. Well appearing and in no acute distress. Cardiovascular: Normal rate, regular rhythm. Grossly normal heart sounds.  Good peripheral circulation. Respiratory: Normal  respiratory effort.  No retractions. Lungs CTAB. Musculoskeletal: No obvious deformity to the lumbar spine.  No guarding palpation of spinal processes.  Patient has equal and full range of motion of the lumbar spine. Neurologic:  Normal speech and language. No gross focal neurologic deficits are appreciated. No gait instability. Skin:  Skin is warm, dry and intact. No rash noted. Psychiatric: Mood and affect are normal. Speech and behavior are normal.  ____________________________________________   LABS (all labs ordered are listed, but only abnormal results are displayed)  Labs Reviewed - No data to  display ____________________________________________  EKG   ____________________________________________  RADIOLOGY  ED MD interpretation:    Official radiology report(s): No results found.  ____________________________________________   PROCEDURES  Procedure(s) performed: None  Procedures  Critical Care performed: No  ____________________________________________   INITIAL IMPRESSION / ASSESSMENT AND PLAN / ED COURSE  As part of my medical decision making, I reviewed the following data within the electronic MEDICAL RECORD NUMBER    Patient presents with resolved low back pain requests return back to work for restrictions.  Physical exam confirmed resolved back pain.  Patient given discharge care instructions and return to work note.      ____________________________________________   FINAL CLINICAL IMPRESSION(S) / ED DIAGNOSES  Final diagnoses:  Radicular low back pain     ED Discharge Orders    None       Note:  This document was prepared using Dragon voice recognition software and may include unintentional dictation errors.    Joni ReiningSmith, Ronald K, PA-C 11/03/17 [id number]    Jene EveryKinner, [place], MD 11/04/17 [phone]

## 2017-11-03 NOTE — ED Triage Notes (Signed)
Pt was seen here a few days ago, needs a recheck and clearance to go back to work.

## 2019-01-06 ENCOUNTER — Other Ambulatory Visit: Payer: Self-pay

## 2019-01-06 ENCOUNTER — Emergency Department
Admission: EM | Admit: 2019-01-06 | Discharge: 2019-01-06 | Disposition: A | Discharge status: Home / Self Care | Payer: Self-pay | Attending: Emergency Medicine | Admitting: Emergency Medicine

## 2019-01-06 ENCOUNTER — Emergency Department: Payer: Self-pay

## 2019-01-06 DIAGNOSIS — M25512 Pain in left shoulder: Secondary | ICD-10-CM | POA: Insufficient documentation

## 2019-01-06 DIAGNOSIS — Z880 Allergy status to penicillin: Secondary | ICD-10-CM | POA: Insufficient documentation

## 2019-01-06 DIAGNOSIS — F17210 Nicotine dependence, cigarettes, uncomplicated: Secondary | ICD-10-CM | POA: Insufficient documentation

## 2019-01-06 IMAGING — CR CHEST - 2 VIEW
2 series · 2 of 2 positions shown · non-contrast
Comparison: None.

CLINICAL DATA: Radiculopathy.  Stabbing pain in the left arm.

EXAM:
CHEST - 2 VIEW

[chest pa]
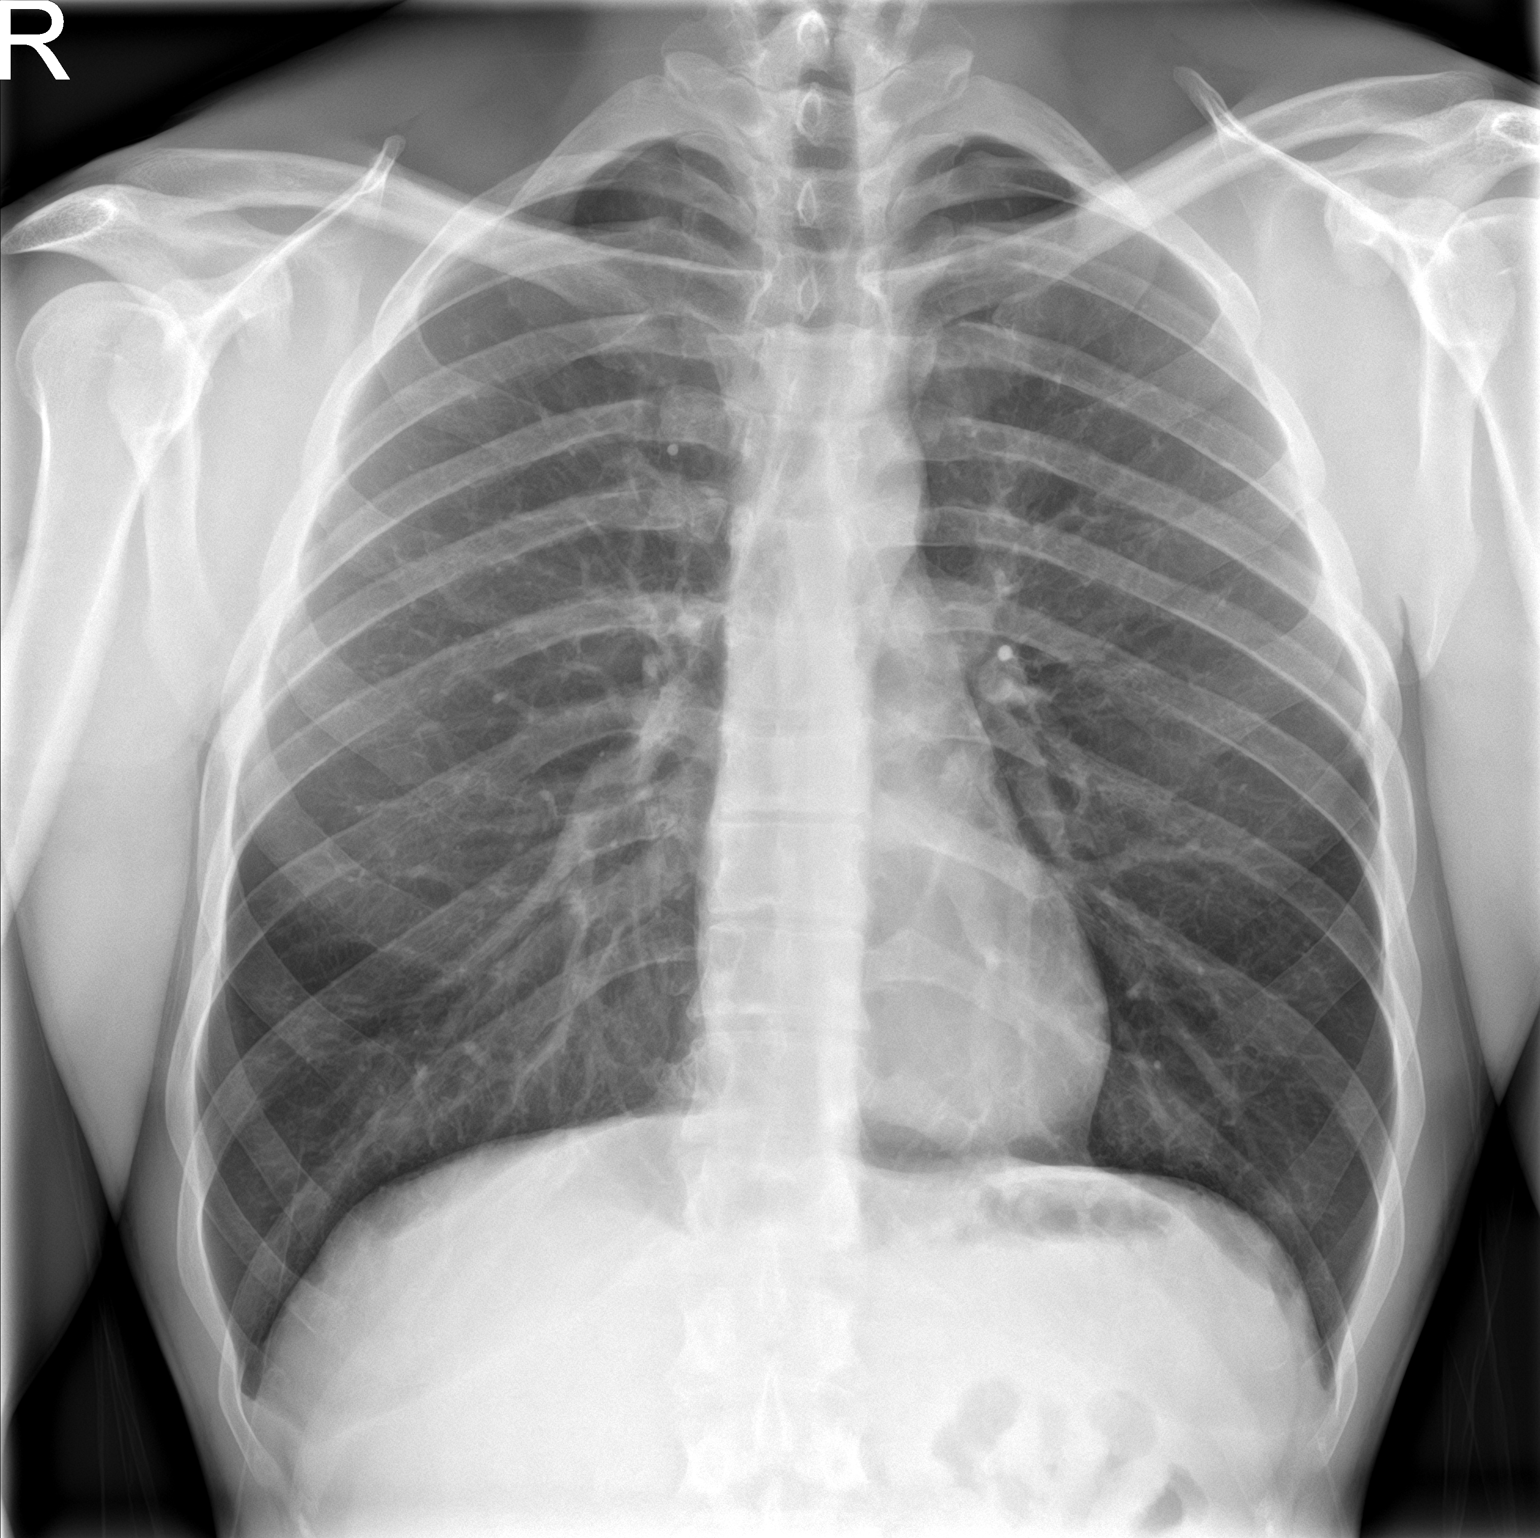

[chest lat]
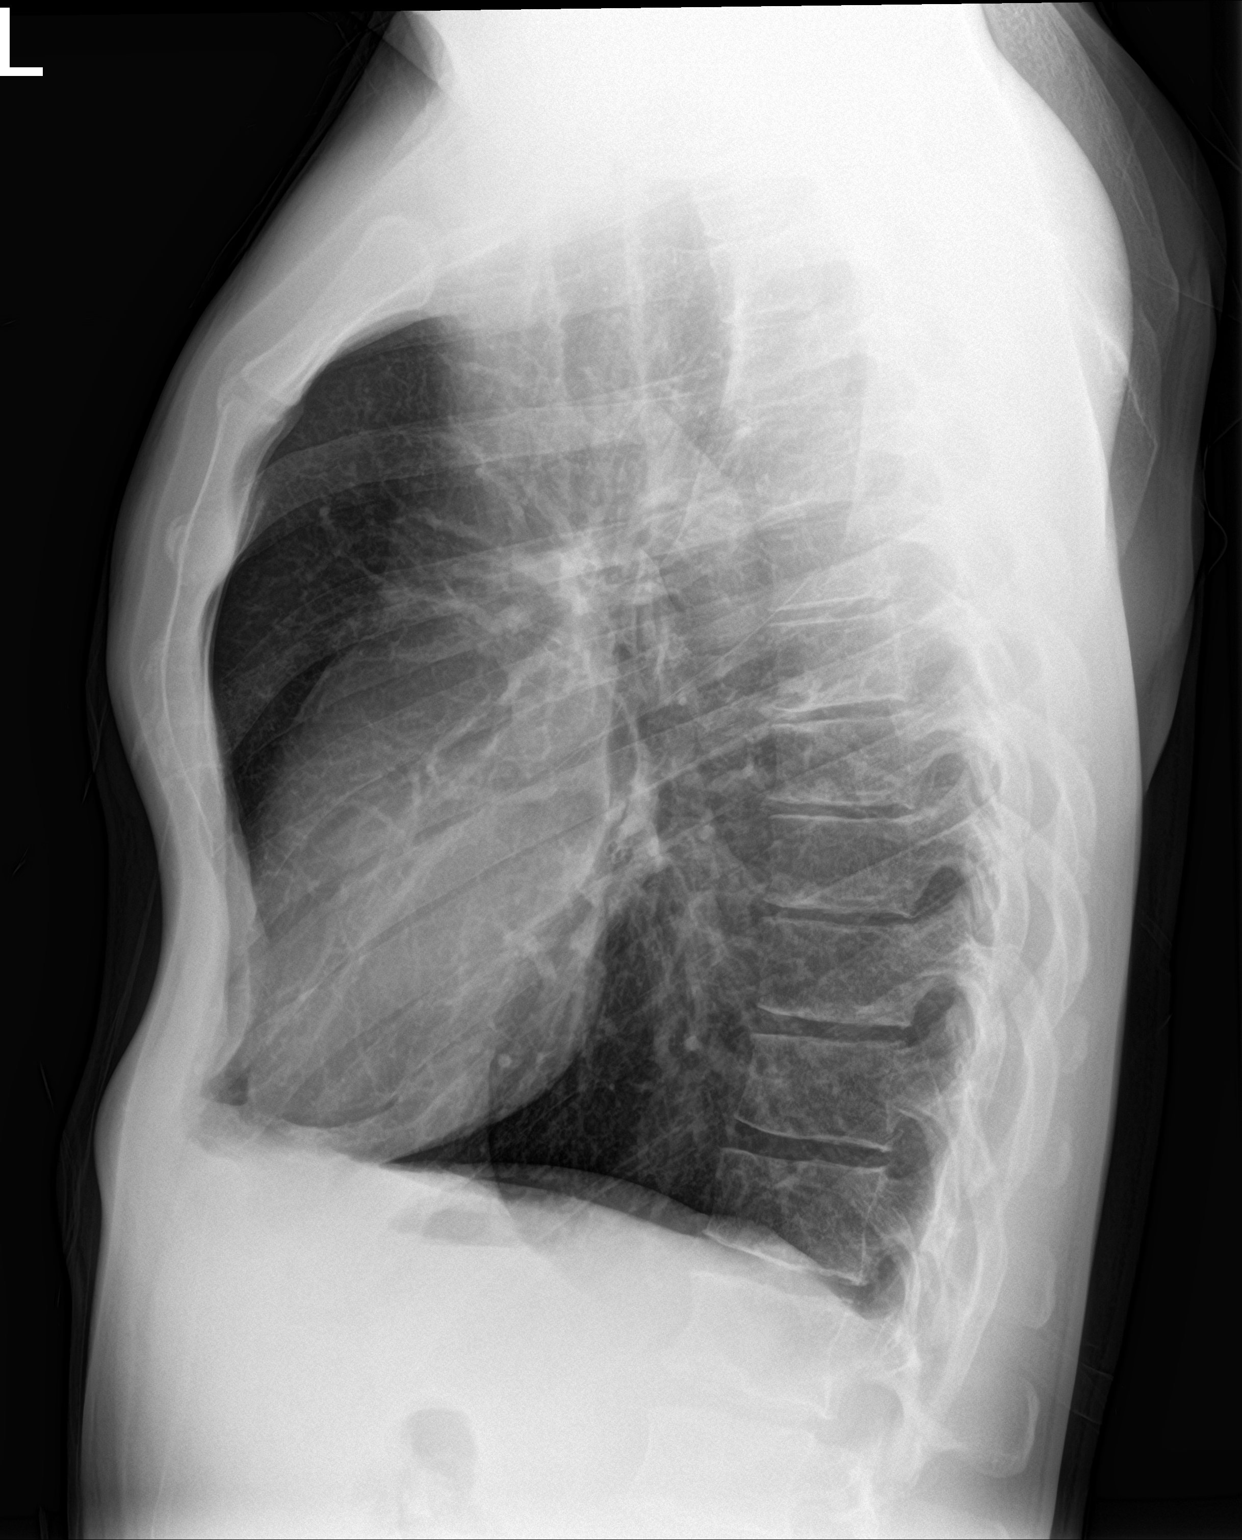

[2 of 2 positions shown; findings below may reference images not displayed]

FINDINGS: The heart size and mediastinal contours are within normal limits.
Both lungs are clear. The visualized skeletal structures are
unremarkable. There is a somewhat indistinct medial margin of the
left scapula.
IMPRESSION: 1. No definite acute displaced fracture or dislocation.
2. Somewhat indistinct medial margin of the left scapula. This is
almost certainly projectional and due to patient positioning.
However given the patient's left arm pain, follow-up with a
dedicated left shoulder series is recommended.

## 2019-01-06 MED ORDER — KETOROLAC TROMETHAMINE 30 MG/ML IJ SOLN
30.0000 mg | Freq: Once | INTRAMUSCULAR | Status: AC
  Administered 2019-01-06: 30 mg via INTRAMUSCULAR
  Filled 2019-01-06: qty 1

## 2019-01-06 MED ORDER — METHOCARBAMOL 500 MG PO TABS: 500.0000 mg | ORAL_TABLET | Freq: Three times a day (TID) | ORAL | 0 refills | Status: AC | PRN

## 2019-01-06 MED ORDER — PREDNISONE 10 MG (21) PO TBPK: ORAL_TABLET | ORAL | 0 refills | Status: DC

## 2019-01-06 NOTE — ED Provider Notes (Signed)
[facility] Emergency Department Provider Note  ____________________________________________  Time seen: Approximately 9:50 PM  I have reviewed the triage vital signs and the nursing notes.   HISTORY  Chief Complaint No chief complaint on file.    HPI Steven Berry is a 33 y.o. male presents to the emergency department with burning left upper shoulder pain that is reproduced with range of motion at the neck over the past 2 weeks.  Patient reports that pain radiates along left upper arm to the elbow.  Patient denies recent falls or traumas.  Patient states that he works out daily and pain does not feel like a muscle strain/sprain.  He denies fever and chills.  No upper extremity weakness.  Patient states that he is a daily smoker and has been reading about how shoulder pain can be associated with lung cancer.  He denies recent weight gain or weight loss, bony pain or night sweats.  No alleviating measures have been attempted.        No past medical history on file.  There are no active problems to display for this patient.   No past surgical history on file.  Prior to Admission medications   Medication Sig Start Date End Date Taking? Authorizing Provider  cyclobenzaprine (FLEXERIL) 5 MG tablet Take 1-2 tablets 3 times daily as needed 11/01/17   Enid Derry, PA-C  ketorolac (TORADOL) 10 MG tablet Take 1 tablet (10 mg total) by mouth every 6 (six) hours as needed. 11/01/17   Enid Derry, PA-C  methocarbamol (ROBAXIN) 500 MG tablet Take 1 tablet (500 mg total) by mouth every 8 (eight) hours as needed for up to 5 days. 01/06/19 01/11/19  Orvil Feil, PA-C  oxyCODONE-acetaminophen (PERCOCET) 5-325 MG tablet Take 1 tablet by mouth every 4 (four) hours as needed for severe pain. 10/22/16   Tommi Rumps, PA-C  predniSONE (STERAPRED UNI-PAK 21 TAB) 10 MG (21) TBPK tablet Take 6 tablets the first day, take 5 tablets the second day, take 4 tablets the third  day, take 3 tablets the fourth day, take 2 tablets the fifth day, take 1 tablet the sixth day. 01/06/19   Orvil Feil, PA-C    Allergies Penicillins  No family history on file.  Social History Social History   Tobacco Use  . Smoking status: Current Every Day Smoker    Packs/day: 1.00    Types: Cigarettes  . Smokeless tobacco: Never Used  Substance Use Topics  . Alcohol use: No  . Drug use: No     Review of Systems  Constitutional: No fever/chills Eyes: No visual changes. No discharge ENT: No upper respiratory complaints. Cardiovascular: no chest pain. Respiratory: no cough. No SOB. Gastrointestinal: No abdominal pain.  No nausea, no vomiting.  No diarrhea.  No constipation. Musculoskeletal: Patient has left shoulder pain.  Skin: Negative for rash, abrasions, lacerations, ecchymosis. Neurological: Negative for headaches, focal weakness or numbness.   ____________________________________________   PHYSICAL EXAM:  VITAL SIGNS: ED Triage Vitals  Enc Vitals Group     BP 01/06/19 2113 129/85     Pulse Rate 01/06/19 2113 78     Resp 01/06/19 2113 16     Temp 01/06/19 2113 98.3 F (36.8 C)     Temp Source 01/06/19 2113 Oral     SpO2 01/06/19 2113 98 %     Weight 01/06/19 2113 155 lb (70.3 kg)     Height 01/06/19 2113 6' (1.829 m)     Head  Circumference --      Peak Flow --      Pain Score 01/06/19 2112 6     Pain Loc --      Pain Edu? --      Excl. in GC? --      Constitutional: Alert and oriented. Well appearing and in no acute distress. Eyes: Conjunctivae are normal. PERRL. EOMI. Head: Atraumatic. Cardiovascular: Normal rate, regular rhythm. Normal S1 and S2.  Good peripheral circulation. Respiratory: Normal respiratory effort without tachypnea or retractions. Lungs CTAB. Good air entry to the bases with no decreased or absent breath sounds. Musculoskeletal: Patient has 5 out of 5 strength in the upper extremities.  No pain to palpation over the left  supraspinatus or the left AC joint.  Patient has no left rotator cuff weakness.  Patient demonstrates full range of motion at the left shoulder and left elbow.  His pain is reproduced with range of motion testing at the neck.  No gross deformities appreciated.  Palpable radial pulse bilaterally and symmetrically. Neurologic:  Normal speech and language. No gross focal neurologic deficits are appreciated.  Skin:  Skin is warm, dry and intact. No rash noted. Psychiatric: Mood and affect are normal. Speech and behavior are normal. Patient exhibits appropriate insight and judgement.   ____________________________________________   LABS (all labs ordered are listed, but only abnormal results are displayed)  Labs Reviewed - No data to display ____________________________________________  EKG   ____________________________________________  RADIOLOGY I personally viewed and evaluated these images as part of my medical decision making, as well as reviewing the written report by the radiologist.    Dg Chest 2 View  Result Date: 01/06/2019 CLINICAL DATA:  Radiculopathy.  Stabbing pain in the left arm. EXAM: CHEST - 2 VIEW COMPARISON:  None. FINDINGS: The heart size and mediastinal contours are within normal limits. Both lungs are clear. The visualized skeletal structures are unremarkable. There is a somewhat indistinct medial margin of the left scapula. IMPRESSION: 1. No definite acute displaced fracture or dislocation. 2. Somewhat indistinct medial margin of the left scapula. This is almost certainly projectional and due to patient positioning. However given the patient's left arm pain, follow-up with a dedicated left shoulder series is recommended. Electronically Signed   By: Katherine Mantlehristopher  Green M.D.   On: 01/06/2019 22:10   Dg Cervical Spine 2-3 Views  Result Date: 01/06/2019 CLINICAL DATA:  Radiculopathy. EXAM: CERVICAL SPINE - 2-3 VIEW COMPARISON:  CT dated 01/13/2016. FINDINGS: The lateral view  is partially obscured by the patient's presumed earrings. There are mild multilevel degenerative changes throughout the cervical spine. No prevertebral soft tissue swelling. No displaced fracture. There is straightening of the normal cervical lordotic curvature. IMPRESSION: No acute osseous abnormality.  Minimal degenerative changes noted. Electronically Signed   By: Katherine Mantlehristopher  Green M.D.   On: 01/06/2019 22:07    ____________________________________________    PROCEDURES  Procedure(s) performed:    Procedures    Medications  ketorolac (TORADOL) 30 MG/ML injection 30 mg (30 mg Intramuscular Given 01/06/19 2253)     ____________________________________________   INITIAL IMPRESSION / ASSESSMENT AND PLAN / ED COURSE  Pertinent labs & imaging results that were available during my care of the patient were reviewed by me and considered in my medical decision making (see chart for details).  Review of the [place] CSRS was performed in accordance of the [facility] prior to dispensing any controlled drugs.         Assessment and Plan:  Left shoulder pain:  Patient presents to the emergency department with burning and radiating left shoulder pain.  On physical exam, vital signs were reassuring.  Patient had reproducible shoulder pain with range of motion at the neck.  Differential diagnosis included cervical radiculopathy, rotator cuff tendinitis and rotator cuff tear...  X-ray examination of the cervical spine and the chest revealed no acute abnormality.  Patient was discharged with tapered prednisone and Robaxin for likely cervical radiculopathy. All patient questions were answered.   ____________________________________________  FINAL CLINICAL IMPRESSION(S) / ED DIAGNOSES  Final diagnoses:  Acute pain of left shoulder      NEW MEDICATIONS STARTED DURING THIS VISIT:  ED Discharge Orders         Ordered    predniSONE (STERAPRED UNI-PAK 21 TAB) 10 MG (21) TBPK tablet      01/06/19 2249    methocarbamol (ROBAXIN) 500 MG tablet  Every 8 hours PRN     01/06/19 2249              This chart was dictated using voice recognition software/Dragon. Despite best efforts to proofread, errors can occur which can change the meaning. Any change was purely unintentional.    Orvil Feil, PA-C 01/06/19 1610    Dionne Bucy, MD 01/06/19 2317

## 2019-01-06 NOTE — ED Triage Notes (Addendum)
Patient reports having constant "stabbing" pain to left upper arm and now radiating into shoulder blade for the past 2 weeks.

## 2019-11-03 ENCOUNTER — Emergency Department: Payer: Self-pay

## 2019-11-03 ENCOUNTER — Encounter: Payer: Self-pay | Admitting: Emergency Medicine

## 2019-11-03 ENCOUNTER — Other Ambulatory Visit: Payer: Self-pay

## 2019-11-03 ENCOUNTER — Emergency Department
Admission: EM | Admit: 2019-11-03 | Discharge: 2019-11-03 | Disposition: A | Discharge status: Home / Self Care | Payer: Self-pay | Attending: Emergency Medicine | Admitting: Emergency Medicine

## 2019-11-03 DIAGNOSIS — F17210 Nicotine dependence, cigarettes, uncomplicated: Secondary | ICD-10-CM | POA: Insufficient documentation

## 2019-11-03 DIAGNOSIS — R1084 Generalized abdominal pain: Secondary | ICD-10-CM | POA: Insufficient documentation

## 2019-11-03 DIAGNOSIS — R109 Unspecified abdominal pain: Secondary | ICD-10-CM

## 2019-11-03 LAB — COMPREHENSIVE METABOLIC PANEL
ALT: 23 U/L (ref 0–44)
AST: 27 U/L (ref 15–41)
Albumin: 4.8 g/dL (ref 3.5–5.0)
Alkaline Phosphatase: 50 U/L (ref 38–126)
Anion gap: 8 (ref 5–15)
BUN: 7 mg/dL (ref 6–20)
CO2: 26 mmol/L (ref 22–32)
Calcium: 9.4 mg/dL (ref 8.9–10.3)
Chloride: 105 mmol/L (ref 98–111)
Creatinine, Ser: 0.9 mg/dL (ref 0.61–1.24)
GFR calc Af Amer: >60 mL/min (ref >60)
GFR calc non Af Amer: >60 mL/min (ref >60)
Glucose, Bld: 102 mg/dL — ABNORMAL HIGH (ref 70–99)
Potassium: 3.9 mmol/L (ref 3.5–5.1)
Sodium: 139 mmol/L (ref 135–145)
Total Bilirubin: 0.7 mg/dL (ref 0.3–1.2)
Total Protein: 8.1 g/dL (ref 6.5–8.1)

## 2019-11-03 LAB — CBC WITH DIFFERENTIAL/PLATELET
Abs Immature Granulocytes: 0.02 10*3/uL (ref 0.00–0.07)
Basophils Absolute: 0 10*3/uL (ref 0.0–0.1)
Basophils Relative: 1 %
Eosinophils Absolute: 0.2 10*3/uL (ref 0.0–0.5)
Eosinophils Relative: 2 %
HCT: 47 % (ref 39.0–52.0)
Hemoglobin: 16 g/dL (ref 13.0–17.0)
Immature Granulocytes: 0 %
Lymphocytes Relative: 19 %
Lymphs Abs: 1.7 10*3/uL (ref 0.7–4.0)
MCH: 30.3 pg (ref 26.0–34.0)
MCHC: 34 g/dL (ref 30.0–36.0)
MCV: 89 fL (ref 80.0–100.0)
Monocytes Absolute: 0.5 10*3/uL (ref 0.1–1.0)
Monocytes Relative: 6 %
Neutro Abs: 6.4 10*3/uL (ref 1.7–7.7)
Neutrophils Relative %: 72 %
Platelets: 189 10*3/uL (ref 150–400)
RBC: 5.28 MIL/uL (ref 4.22–5.81)
RDW: 13.1 % (ref 11.5–15.5)
WBC: 8.9 10*3/uL (ref 4.0–10.5)
nRBC: 0 % (ref 0.0–0.2)

## 2019-11-03 LAB — LIPASE, BLOOD: Lipase: 35 U/L (ref 11–51)

## 2019-11-03 MED ORDER — DICYCLOMINE HCL 10 MG PO CAPS
10.0000 mg | ORAL_CAPSULE | Freq: Once | ORAL | Status: AC
  Administered 2019-11-03: 10 mg via ORAL
  Filled 2019-11-03: qty 1

## 2019-11-03 MED ORDER — IOHEXOL 300 MG/ML  SOLN
100.0000 mL | Freq: Once | INTRAMUSCULAR | Status: AC | PRN
  Administered 2019-11-03: 100 mL via INTRAVENOUS

## 2019-11-03 MED ORDER — ACETAMINOPHEN 500 MG PO TABS
1000.0000 mg | ORAL_TABLET | Freq: Once | ORAL | Status: AC
  Administered 2019-11-03: 1000 mg via ORAL
  Filled 2019-11-03: qty 2

## 2019-11-03 MED ORDER — KETOROLAC TROMETHAMINE 30 MG/ML IJ SOLN
15.0000 mg | Freq: Once | INTRAMUSCULAR | Status: AC
  Administered 2019-11-03: 15 mg via INTRAVENOUS
  Filled 2019-11-03: qty 1

## 2019-11-03 NOTE — ED Triage Notes (Signed)
Here for mid abdominal pain since Monday. No accompanying sx.  No NVD.  No fever.  Has had some constipation, took laxative today with mild relief.  Ambulatory.

## 2019-11-03 NOTE — ED Provider Notes (Signed)
[facility] Emergency Department Provider Note  ____________________________________________   First MD Initiated Contact with Patient 11/03/19 [phone]     (approximate)  I have reviewed the triage vital signs and the nursing notes.   HISTORY  Chief Complaint Abdominal Pain    HPI Steven Berry is a 34 y.o. male with no prior history of abdominal surgeries who comes in for diffuse abdominal pain.  Patient stated that he initially had pain started on Monday, 4 days ago.  Patient states that the pain has gotten worse and is now over his entire abdomen.  Patient did take a laxative early this morning was able to have a bowel movement that was normal in caliber.  This did not help the pain.  He denies anything making the pain worse.  Denies any vomiting, fevers, chest pain, shortness of breath.  Patient denies daily alcohol use.  Denies frequent NSAID use.  Patient does smoke cigarettes          History reviewed. No pertinent past medical history.  There are no problems to display for this patient.   History reviewed. No pertinent surgical history.  Prior to Admission medications   Medication Sig Start Date End Date Taking? Authorizing Provider  cyclobenzaprine (FLEXERIL) 5 MG tablet Take 1-2 tablets 3 times daily as needed 11/01/17   Enid Derry, PA-C  ketorolac (TORADOL) 10 MG tablet Take 1 tablet (10 mg total) by mouth every 6 (six) hours as needed. 11/01/17   Enid Derry, PA-C  oxyCODONE-acetaminophen (PERCOCET) 5-325 MG tablet Take 1 tablet by mouth every 4 (four) hours as needed for severe pain. 10/22/16   Tommi Rumps, PA-C  predniSONE (STERAPRED UNI-PAK 21 TAB) 10 MG (21) TBPK tablet Take 6 tablets the first day, take 5 tablets the second day, take 4 tablets the third day, take 3 tablets the fourth day, take 2 tablets the fifth day, take 1 tablet the sixth day. 01/06/19   Orvil Feil, PA-C    Allergies Penicillins  History reviewed. No  pertinent family history.  Social History Social History   Tobacco Use  . Smoking status: Current Every Day Smoker    Packs/day: 1.00    Types: Cigarettes  . Smokeless tobacco: Never Used  Substance Use Topics  . Alcohol use: No  . Drug use: No      Review of Systems Constitutional: No fever/chills Eyes: No visual changes. ENT: No sore throat. Cardiovascular: Denies chest pain. Respiratory: Denies shortness of breath. Gastrointestinal: Positive abdominal pain no nausea, no vomiting.  No diarrhea.  No constipation. Genitourinary: Negative for dysuria. Musculoskeletal: Negative for back pain. Skin: Negative for rash. Neurological: Negative for headaches, focal weakness or numbness. All other ROS negative ____________________________________________   PHYSICAL EXAM:  VITAL SIGNS: Blood pressure 130/67, pulse 61, temperature 98 F (36.7 C), temperature source Oral, resp. rate 16, height 5\' 11"  (1.803 m), weight 77.1 kg, SpO2 100 %.  Constitutional: Alert and oriented. Well appearing and in no acute distress. Eyes: Conjunctivae are normal. EOMI. Head: Atraumatic. Nose: No congestion/rhinnorhea. Mouth/Throat: Mucous membranes are moist.   Neck: No stridor. Trachea Midline. FROM Cardiovascular: Normal rate, regular rhythm. Grossly normal heart sounds.  Good peripheral circulation. Respiratory: Normal respiratory effort.  No retractions. Lungs CTAB. Gastrointestinal: Soft with mild tenderness throughout no distention. No abdominal bruits.  Musculoskeletal: No lower extremity tenderness nor edema.  No joint effusions. Neurologic:  Normal speech and language. No gross focal neurologic deficits are appreciated.  Skin:  Skin  is warm, dry and intact. No rash noted. Psychiatric: Mood and affect are normal. Speech and behavior are normal. GU: Deferred   ____________________________________________   LABS (all labs ordered are listed, but only abnormal results are  displayed)  Labs Reviewed  COMPREHENSIVE METABOLIC PANEL - Abnormal; Notable for the following components:      Result Value   Glucose, Bld 102 (*)    All other components within normal limits  CBC WITH DIFFERENTIAL/PLATELET  LIPASE, BLOOD  URINALYSIS, COMPLETE (UACMP) WITH MICROSCOPIC   ____________________________________________  RADIOLOGY   Official radiology report(s): CT ABDOMEN PELVIS W CONTRAST  Result Date: 11/03/2019 CLINICAL DATA:  Abdominal distention with right lower quadrant pain for 5 days. EXAM: CT ABDOMEN AND PELVIS WITH CONTRAST TECHNIQUE: Multidetector CT imaging of the abdomen and pelvis was performed using the standard protocol following bolus administration of intravenous contrast. CONTRAST:  156mL OMNIPAQUE IOHEXOL 300 MG/ML  SOLN COMPARISON:  None. FINDINGS: Lower chest:  No contributory findings. Hepatobiliary: No focal liver abnormality.No evidence of biliary obstruction or stone. Pancreas: Unremarkable. Spleen: Unremarkable. Adrenals/Urinary Tract: Negative adrenals. No hydronephrosis or stone. Unremarkable bladder. Stomach/Bowel: No obstruction. No appendicitis. No bowel wall thickening when accounting for under distended segments. Vascular/Lymphatic: No acute vascular abnormality. Mild prominence of ileocolic lymph nodes but not reaching pathologic threshold. Reproductive:No pathologic findings. Other: No ascites or pneumoperitoneum. Musculoskeletal: No acute abnormalities. 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 IMPRESSION: No acute finding or explanation for abdominal pain. Electronically Signed   By: Monte Fantasia M.D.   On: 11/03/2019 11:21    ____________________________________________   PROCEDURES  Procedure(s) performed (including Critical Care):  Procedures   ____________________________________________   INITIAL IMPRESSION / ASSESSMENT AND PLAN / ED COURSE  ORRY SIGL was evaluated  in Emergency Department on 11/03/2019 for the symptoms described in the history of present illness. He was evaluated in the context of the global COVID-19 pandemic, which necessitated consideration that the patient might be at risk for infection with the SARS-CoV-2 virus that causes COVID-19. Institutional protocols and algorithms that pertain to the evaluation of patients at risk for COVID-19 are in a state of rapid change based on information released by regulatory bodies including the CDC and federal and state organizations. These policies and algorithms were followed during the patient's care in the ED.    Patient is a well-appearing 34 year old who comes in with abdominal pain that he is reporting is diffusely throughout his abdomen.  Given this is been going on for so long discussed with patient pros and cons of CT imaging.  Have elected to proceed with CT to rule out evidence of diverticulitis, appendicitis, perforation, cholecystitis, pancreatitis.  Will get labs to evaluate for electrolyte abnormalities versus AKI.  Patient is driving home so we will have to hold off on narcotics.  We will give a dose of Tylenol and Bentyl while awaiting imaging.  Labs are reassuring.  No evidence of anemia.  No LFT or lipase elevation.  CT imaging was also negative.  12:11 PM reevaluated patient and pain is better.  Patient states pain is currently a 3 out of 10 underneath his bellybutton.  Discussed the reassuring labs and CT imaging.  Will give a dose of Toradol.  We discussed Tylenol ibuprofen for any discomfort and returning for fevers, vomiting or any other concerns.  Patient denies any urinary symptoms and does not want to stay for the sample.  I discussed the provisional nature of ED diagnosis, the treatment so  far, the ongoing plan of care, follow up appointments and return precautions with the patient and any family or support people present. They expressed understanding and agreed with the plan,  discharged home.  ____________________________________________   FINAL CLINICAL IMPRESSION(S) / ED DIAGNOSES   Final diagnoses:  Abdominal pain, unspecified abdominal location      MEDICATIONS GIVEN DURING THIS VISIT:  Medications  ketorolac (TORADOL) 30 MG/ML injection 15 mg (has no administration in time range)  dicyclomine (BENTYL) capsule 10 mg (10 mg Oral Given 11/03/19 0957)  acetaminophen (TYLENOL) tablet 1,000 mg (1,000 mg Oral Given 11/03/19 0957)  iohexol (OMNIPAQUE) 300 MG/ML solution 100 mL (100 mLs Intravenous Contrast Given 11/03/19 1103)     ED Discharge Orders    None       Note:  This document was prepared using Dragon voice recognition software and may include unintentional dictation errors.   Concha Se, MD 11/03/19 1213

## 2019-11-03 NOTE — Discharge Instructions (Signed)
Your CT and labs are reassuring.  You can return to the ER if you develop fevers, vomiting, worsening pain.  You take Tylenol 1 g every 8 hours and use occasional ibuprofen to help with any discomfort.

## 2022-07-08 ENCOUNTER — Emergency Department
Admission: EM | Admit: 2022-07-08 | Discharge: 2022-07-08 | Disposition: A | Discharge status: Home / Self Care | Payer: Self-pay | Attending: Emergency Medicine | Admitting: Emergency Medicine

## 2022-07-08 ENCOUNTER — Other Ambulatory Visit: Payer: Self-pay

## 2022-07-08 DIAGNOSIS — R6883 Chills (without fever): Secondary | ICD-10-CM | POA: Insufficient documentation

## 2022-07-08 DIAGNOSIS — R202 Paresthesia of skin: Secondary | ICD-10-CM | POA: Insufficient documentation

## 2022-07-08 DIAGNOSIS — Z1152 Encounter for screening for COVID-19: Secondary | ICD-10-CM | POA: Insufficient documentation

## 2022-07-08 DIAGNOSIS — R079 Chest pain, unspecified: Secondary | ICD-10-CM | POA: Insufficient documentation

## 2022-07-08 DIAGNOSIS — Z5321 Procedure and treatment not carried out due to patient leaving prior to being seen by health care provider: Secondary | ICD-10-CM | POA: Insufficient documentation

## 2022-07-08 LAB — RESP PANEL BY RT-PCR (FLU A&B, COVID) ARPGX2
Influenza A by PCR: NEGATIVE
Influenza B by PCR: NEGATIVE
SARS Coronavirus 2 by RT PCR: NEGATIVE

## 2022-07-08 LAB — CBC
HCT: 44.9 % (ref 39.0–52.0)
Hemoglobin: 15.1 g/dL (ref 13.0–17.0)
MCH: 30 pg (ref 26.0–34.0)
MCHC: 33.6 g/dL (ref 30.0–36.0)
MCV: 89.1 fL (ref 80.0–100.0)
Platelets: 242 10*3/uL (ref 150–400)
RBC: 5.04 MIL/uL (ref 4.22–5.81)
RDW: 12.8 % (ref 11.5–15.5)
WBC: 9.6 10*3/uL (ref 4.0–10.5)
nRBC: 0 % (ref 0.0–0.2)

## 2022-07-08 LAB — BASIC METABOLIC PANEL
Anion gap: 5 (ref 5–15)
BUN: 9 mg/dL (ref 6–20)
CO2: 28 mmol/L (ref 22–32)
Calcium: 9.3 mg/dL (ref 8.9–10.3)
Chloride: 104 mmol/L (ref 98–111)
Creatinine, Ser: 0.75 mg/dL (ref 0.61–1.24)
GFR, Estimated: >60 mL/min (ref >60)
Glucose, Bld: 91 mg/dL (ref 70–99)
Potassium: 3.9 mmol/L (ref 3.5–5.1)
Sodium: 137 mmol/L (ref 135–145)

## 2022-07-08 LAB — CBG MONITORING, ED: Glucose-Capillary: 106 mg/dL — ABNORMAL HIGH (ref 70–99)

## 2022-07-08 NOTE — ED Triage Notes (Addendum)
States while driving head and body became numb.  Feet remain numb.  States he has only had one thing to eat today.  States feels dizzy.  Also c/o pain to chest - similar to when he has gas build up in stomach.  Also c/o cold chills.  AAOx3.  Skin warm and dry. NAD.  Ambulates independently.  Gait steady.  POsture upright and relaxed.

## 2022-07-13 ENCOUNTER — Emergency Department (HOSPITAL_COMMUNITY): Payer: Self-pay

## 2022-07-13 ENCOUNTER — Emergency Department (HOSPITAL_COMMUNITY)
Admission: EM | Admit: 2022-07-13 | Discharge: 2022-07-13 | Disposition: A | Discharge status: Home / Self Care | Payer: Self-pay | Attending: Student | Admitting: Student

## 2022-07-13 ENCOUNTER — Other Ambulatory Visit: Payer: Self-pay

## 2022-07-13 DIAGNOSIS — R0789 Other chest pain: Secondary | ICD-10-CM

## 2022-07-13 DIAGNOSIS — F419 Anxiety disorder, unspecified: Secondary | ICD-10-CM | POA: Insufficient documentation

## 2022-07-13 DIAGNOSIS — F17210 Nicotine dependence, cigarettes, uncomplicated: Secondary | ICD-10-CM | POA: Insufficient documentation

## 2022-07-13 LAB — BASIC METABOLIC PANEL
Anion gap: 8 (ref 5–15)
BUN: 8 mg/dL (ref 6–20)
CO2: 27 mmol/L (ref 22–32)
Calcium: 9 mg/dL (ref 8.9–10.3)
Chloride: 103 mmol/L (ref 98–111)
Creatinine, Ser: 0.76 mg/dL (ref 0.61–1.24)
GFR, Estimated: >60 mL/min (ref >60)
Glucose, Bld: 99 mg/dL (ref 70–99)
Potassium: 3.9 mmol/L (ref 3.5–5.1)
Sodium: 138 mmol/L (ref 135–145)

## 2022-07-13 LAB — CBC
HCT: 45.5 % (ref 39.0–52.0)
Hemoglobin: 15.3 g/dL (ref 13.0–17.0)
MCH: 30 pg (ref 26.0–34.0)
MCHC: 33.6 g/dL (ref 30.0–36.0)
MCV: 89.2 fL (ref 80.0–100.0)
Platelets: 237 10*3/uL (ref 150–400)
RBC: 5.1 MIL/uL (ref 4.22–5.81)
RDW: 13 % (ref 11.5–15.5)
WBC: 7.1 10*3/uL (ref 4.0–10.5)
nRBC: 0 % (ref 0.0–0.2)

## 2022-07-13 LAB — TROPONIN I (HIGH SENSITIVITY)
Troponin I (High Sensitivity): 3 ng/L (ref <18)
Troponin I (High Sensitivity): 3 ng/L (ref <18)

## 2022-07-13 MED ORDER — LORAZEPAM 1 MG PO TABS
0.5000 mg | ORAL_TABLET | Freq: Once | ORAL | Status: AC
  Administered 2022-07-13: 0.5 mg via ORAL
  Filled 2022-07-13: qty 1

## 2022-07-13 MED ORDER — HYDROXYZINE HCL 25 MG PO TABS: 25.0000 mg | ORAL_TABLET | Freq: Four times a day (QID) | ORAL | 0 refills | Status: DC

## 2022-07-13 NOTE — Discharge Planning (Signed)
RNCM consulted regarding PCP establishment and insurance enrollment. Pt presented to [facility] ED today with chest pain. RNCM met with pt at bedside; pt confirms not having access to follow up care with PCP or insurance coverage. Discussed with patient importance and benefits of establishing PCP, and not utilizing the ED for primary care needs. Pt verbalized understanding and is in agreement. Discussed other options, provided list of local  affordable PCPs. RNCM obtained appointment on (12/27), time (1015) with [facility] and placed on After Visit Summary paperwork.  No further case management needs communicated at this time. Gurdeep Keesey J. Clydene Laming, [place], [place], [place]

## 2022-07-13 NOTE — ED Provider Notes (Signed)
[facility] EMERGENCY DEPARTMENT Provider Note  CSN: [id number] Arrival date & time: 07/13/22 0700  Chief Complaint(s) Chest Pain  HPI Steven Berry is a 36 y.o. male who presents emergency department for evaluation of multiple complaints including chest pain, presyncope, numbness, tingling.  Patient states that today he had an episode of chest tightness with numbness across both arms that lasted approximately 45 seconds.  He states that this has been happening at random over the last 1 week and is not associated with exertion or shortness of breath.  No associated diaphoresis, nausea, vomiting.  He states that he is under an increased amount of stress as he is having difficulty finding hours to work at his job.  Denies illicit substance use or alcohol use.  Currently states that he feels his chest is tight.   Past Medical History No past medical history on file. There are no problems to display for this patient.  Home Medication(s) Prior to Admission medications   Medication Sig Start Date End Date Taking? Authorizing Provider  hydrOXYzine (ATARAX) 25 MG tablet Take 1 tablet (25 mg total) by mouth every 6 (six) hours. 07/13/22  Yes Abryana Lykens, MD  cyclobenzaprine (FLEXERIL) 5 MG tablet Take 1-2 tablets 3 times daily as needed Patient not taking: Reported on 11/03/2019 11/01/17   Enid Derry, PA-C  ketorolac (TORADOL) 10 MG tablet Take 1 tablet (10 mg total) by mouth every 6 (six) hours as needed. Patient not taking: Reported on 11/03/2019 11/01/17   Enid Derry, PA-C  oxyCODONE-acetaminophen (PERCOCET) 5-325 MG tablet Take 1 tablet by mouth every 4 (four) hours as needed for severe pain. Patient not taking: Reported on 11/03/2019 10/22/16   Tommi Rumps, PA-C  predniSONE (STERAPRED UNI-PAK 21 TAB) 10 MG (21) TBPK tablet Take 6 tablets the first day, take 5 tablets the second day, take 4 tablets the third day, take 3 tablets the fourth day, take 2 tablets the  fifth day, take 1 tablet the sixth day. Patient not taking: Reported on 11/03/2019 01/06/19   Gasper Lloyd                                                                                                                                    Past Surgical History No past surgical history on file. Family History No family history on file.  Social History Social History   Tobacco Use   Smoking status: Every Day    Packs/day: 1.00    Types: Cigarettes   Smokeless tobacco: Never  Substance Use Topics   Alcohol use: No   Drug use: No   Allergies Penicillins  Review of Systems Review of Systems  Respiratory:  Positive for chest tightness.   Cardiovascular:  Positive for chest pain.  Neurological:  Positive for dizziness and light-headedness.    Physical Exam Vital Signs  I have reviewed the triage vital signs BP (!) 139/96 (BP  Location: Left Arm)   Pulse 64   Temp 97.9 F (36.6 C) (Oral)   Resp 16   Ht 5\' 11"  (1.803 m)   Wt 73.5 kg   SpO2 99%   BMI 22.59 kg/m   Physical Exam Constitutional:      General: He is not in acute distress.    Appearance: Normal appearance.  HENT:     Head: Normocephalic and atraumatic.     Nose: No congestion or rhinorrhea.  Eyes:     General:        Right eye: No discharge.        Left eye: No discharge.     Extraocular Movements: Extraocular movements intact.     Pupils: Pupils are equal, round, and reactive to light.  Cardiovascular:     Rate and Rhythm: Normal rate and regular rhythm.     Heart sounds: No murmur heard. Pulmonary:     Effort: No respiratory distress.     Breath sounds: No wheezing or rales.  Abdominal:     General: There is no distension.     Tenderness: There is no abdominal tenderness.  Musculoskeletal:        General: Normal range of motion.     Cervical back: Normal range of motion.  Skin:    General: Skin is warm and dry.  Neurological:     General: No focal deficit present.     Mental Status: He  is alert.     ED Results and Treatments Labs (all labs ordered are listed, but only abnormal results are displayed) Labs Reviewed  BASIC METABOLIC PANEL  CBC  TROPONIN I (HIGH SENSITIVITY)  TROPONIN I (HIGH SENSITIVITY)                                                                                                                          Radiology DG Chest 2 View  Result Date: 07/13/2022 CLINICAL DATA:  36 year old male with history of chest pain. EXAM: CHEST - 2 VIEW COMPARISON:  Chest x-ray 01/06/2019. FINDINGS: Lung volumes are normal. No consolidative airspace disease. No pleural effusions. No pneumothorax. No pulmonary nodule or mass noted. Pulmonary vasculature and the cardiomediastinal silhouette are within normal limits. IMPRESSION: No radiographic evidence of acute cardiopulmonary disease. Electronically Signed   By: Trudie Reedaniel  Entrikin M.D.   On: 07/13/2022 07:37    Pertinent labs & imaging results that were available during my care of the patient were reviewed by me and considered in my medical decision making (see MDM for details).  Medications Ordered in ED Medications  LORazepam (ATIVAN) tablet 0.5 mg (0.5 mg Oral Given 07/13/22 1309)  Procedures Procedures  (including critical care time)  Medical Decision Making / ED Course   This patient presents to the ED for concern of chest tightness, dizziness, numbness, this involves an extensive number of treatment options, and is a complaint that carries with it a high risk of complications and morbidity.  The differential diagnosis includes electrolyte abnormality, anxiety, ACS, PE, pneumonia, neuropathy, panic attack  MDM: Patient seen the emergency room for evaluation of multiple complaints described above.  Physical exam is unremarkable.  Laboratory evaluation including high-sensitivity  troponin is unremarkable.  ECG was sinus rhythm and a right bundle branch block but this has been consistent over multiple ER evaluations over almost a decade.  No evidence of WPW or Brugada.  Patient is PERC negative and I have low suspicion for pulmonary embolism.  Patient's heart score is 2 and I have low suspicion for ACS.  We did a small Ativan trial and after receiving 0.5 mg p.o. Ativan his symptoms seem to have significant improved.  Suspect an underlying element of anxiety as a source of his symptoms and possible panic disorder.  Believe the patient would benefit from establishing primary care and likely outpatient psychiatric resources and thus I consulted the [facility] providers to help the patient set up a primary care physician appointment.  Patient will be discharged on Atarax and was given strict return precautions which he voiced understanding he was discharged.   Additional history obtained:  -External records from outside source obtained and reviewed including: Chart review including previous notes, labs, imaging, consultation notes   Lab Tests: -I ordered, reviewed, and interpreted labs.   The pertinent results include:   Labs Reviewed  BASIC METABOLIC PANEL  CBC  TROPONIN I (HIGH SENSITIVITY)  TROPONIN I (HIGH SENSITIVITY)      EKG   EKG Interpretation  Date/Time:  Monday July 13 2022 07:07:26 EST Ventricular Rate:  82 PR Interval:  166 QRS Duration: 122 QT Interval:  362 QTC Calculation: 422 R Axis:   97 Text Interpretation: Normal sinus rhythm with sinus arrhythmia Rightward axis When compared with ECG of 08-Jul-2022 16:08, PREVIOUS ECG IS PRESENT Confirmed by Yanky Vanderburg ([id number]) on 07/13/2022 12:19:08 PM         Imaging Studies ordered: I ordered imaging studies including chest x-ray I independently visualized and interpreted imaging. I agree with the radiologist interpretation   Medicines ordered and prescription drug management: Meds ordered this  encounter  Medications   LORazepam (ATIVAN) tablet 0.5 mg   hydrOXYzine (ATARAX) 25 MG tablet    Sig: Take 1 tablet (25 mg total) by mouth every 6 (six) hours.    Dispense:  12 tablet    Refill:  0    -I have reviewed the patients home medicines and have made adjustments as needed  Critical interventions none  Consultations Obtained: I requested consultation with the [facility] team,  and discussed lab and imaging findings as well as pertinent plan - they recommend: PCP appointment established   Cardiac Monitoring: The patient was maintained on a cardiac monitor.  I personally viewed and interpreted the cardiac monitored which showed an underlying rhythm of: NSR  Social Determinants of Health:  Factors impacting patients care include: Increase stressors at home   Reevaluation: After the interventions noted above, I reevaluated the patient and found that they have :improved  Co morbidities that complicate the patient evaluation No past medical history on file.    Dispostion: I considered admission for this patient, but he currently does not  meet inpatient criteria for admission and he is safe for discharge with outpatient follow-up     Final Clinical Impression(s) / ED Diagnoses Final diagnoses:  Atypical chest pain  Anxiety     @PCDICTATION @    Nickie Deren, , MD 07/13/22 [phone]

## 2022-07-13 NOTE — ED Triage Notes (Signed)
Pt states the he had a episode of chest tightness and numbess across both arms that lasted about 45 seconds. Pt states chest still feels tight at this time.   Pt states that this happened last week and he didn't come in to be seen initially and ended up getting dizzy. Last week he left after triage due to wait time

## 2022-07-13 NOTE — ED Notes (Signed)
Got patient into a gown on the monitor patient is resting with call bell in reach 

## 2022-08-12 ENCOUNTER — Ambulatory Visit (INDEPENDENT_AMBULATORY_CARE_PROVIDER_SITE_OTHER): Payer: Self-pay

## 2022-08-12 ENCOUNTER — Other Ambulatory Visit: Payer: Self-pay

## 2022-08-12 VITALS — BP 129/73 | HR 79 | Temp 98.4°F | Ht 71.0 in | Wt 163.3 lb

## 2022-08-12 DIAGNOSIS — Z131 Encounter for screening for diabetes mellitus: Secondary | ICD-10-CM

## 2022-08-12 DIAGNOSIS — Z13220 Encounter for screening for lipoid disorders: Secondary | ICD-10-CM

## 2022-08-12 DIAGNOSIS — Z136 Encounter for screening for cardiovascular disorders: Secondary | ICD-10-CM

## 2022-08-12 DIAGNOSIS — R0789 Other chest pain: Secondary | ICD-10-CM | POA: Insufficient documentation

## 2022-08-12 DIAGNOSIS — Z130 Encounter for screening for diseases of the blood and blood-forming organs and certain disorders involving the immune mechanism: Secondary | ICD-10-CM

## 2022-08-12 DIAGNOSIS — Z0000 Encounter for general adult medical examination without abnormal findings: Secondary | ICD-10-CM

## 2022-08-12 MED ORDER — SERTRALINE HCL 25 MG PO TABS: 25.0000 mg | ORAL_TABLET | Freq: Every day | ORAL | 0 refills | Status: DC

## 2022-08-12 MED ORDER — LORAZEPAM 0.5 MG PO TABS: 0.5000 mg | ORAL_TABLET | Freq: Three times a day (TID) | ORAL | 0 refills | Status: DC | PRN

## 2022-08-12 NOTE — Assessment & Plan Note (Signed)
Patient describes an episode of chest tightness, burning, palpitations, numbness in his head when he was driving which spread to the rest of his body, and a feeling of almost passing out. He had a snack and came to the ED but felt better on the way and went home. They thought he had a panic attack. He has had more episodes since that point typically in the morning or when driving. He also feels nausea, sweating, shakes, and dry mouth. He also has headaches on the sides of his head and mentions that his peripheral vision shrinks when this happens. He experiences a lot of anxiety and stress about these symptoms. He has anxiety about experiencing these symptoms again and having more episodes.

## 2022-08-12 NOTE — Patient Instructions (Addendum)
.  [name], it was a pleasure seeing you today! You endorsed feeling well today. Below are some of the things we talked about this visit. We look forward to seeing you in the follow up appointment!  Today we discussed: I have prescribed zoloft 25mg  which you can start taking daily. It might take a few weeks to kick in and we can always go up on the dose. You can use the Ativan (I prescribed 10 pills) if you have bad episodes in the meantime. We can check your blood work once the insurance situation is figured out and [employer] with the results.  I have ordered the following labs today:  Lab Orders         TSH         Lipid Profile         BMP8+Anion Gap         CBC with Diff         Hemoglobin A1c        Referrals ordered today:   Referral Orders  No referral(s) requested today     I have ordered the following medication/changed the following medications:   Stop the following medications: Medications Discontinued During This Encounter  Medication Reason   cyclobenzaprine (FLEXERIL) 5 MG tablet    hydrOXYzine (ATARAX) 25 MG tablet    ketorolac (TORADOL) 10 MG tablet    oxyCODONE-acetaminophen (PERCOCET) 5-325 MG tablet    predniSONE (STERAPRED UNI-PAK 21 TAB) 10 MG (21) TBPK tablet      Start the following medications: Meds ordered this encounter  Medications   sertraline (ZOLOFT) 25 MG tablet    Sig: Take 1 tablet (25 mg total) by mouth daily.    Dispense:  30 tablet    Refill:  0   LORazepam (ATIVAN) 0.5 MG tablet    Sig: Take 1 tablet (0.5 mg total) by mouth every 8 (eight) hours as needed for anxiety.    Dispense:  10 tablet    Refill:  0     Follow-up:  4 weeks    Please make sure to arrive 15 minutes prior to your next appointment. If you arrive late, you may be asked to reschedule.   We look forward to seeing you next time. Please call our clinic at [phone] if you have any questions or concerns. The best time to call is Monday-Friday from  9am-4pm, but there is someone available 24/7. If after hours or the weekend, call the main hospital number and ask for the Internal Medicine Resident On-Call. If you need medication refills, please notify your pharmacy one week in advance and they will send [place] a request.  Thank you for letting us take part in your care. Wishing you the best!  Thank you, Linus Galas MD

## 2022-08-13 DIAGNOSIS — F909 Attention-deficit hyperactivity disorder, unspecified type: Secondary | ICD-10-CM | POA: Insufficient documentation

## 2022-08-13 DIAGNOSIS — Z0000 Encounter for general adult medical examination without abnormal findings: Secondary | ICD-10-CM | POA: Insufficient documentation

## 2022-08-13 NOTE — Progress Notes (Signed)
   CC: Chest tightness  HPI:  [name] is a 37 y.o.-year-old male with past medical history as below presenting for chest tightness.  Please see encounters tab for problem-based charting.  Past Medical History:  Diagnosis Date   ADHD    Review of Systems: As in HPI.  Please see encounters tab for problem based charting.  Physical Exam:  Vitals:   08/12/22 1557  BP: 129/73  Pulse: 79  Temp: 98.4 F (36.9 C)  TempSrc: Oral  SpO2: 97%  Weight: 163 lb 4.8 oz (74.1 kg)  Height: 5\' 11"  (1.803 m)   General:Well-appearing, pleasant, In NAD Cardiac: RRR, no murmurs rubs or gallops. Respiratory: Normal work of breathing on room air, CTAB Abdominal: Soft, nontender, nondistended   Assessment & Plan:   Feeling of chest tightness Patient describes an episode of chest tightness, burning, palpitations, numbness in his head when he was driving which spread to the rest of his body, and a feeling of almost passing out. He had a snack and came to the ED but felt better on the way and went home. They thought he had a panic attack. He has had more episodes since that point typically in the morning or when driving. He also feels nausea, sweating, shakes, and dry mouth. He also has headaches on the sides of his head and mentions that his peripheral vision shrinks when this happens. He experiences a lot of anxiety and stress about these symptoms and experiencing these symptoms again/having more episodes.  Cardiac and respiratory exam today unremarkable.  Of note, on his EKG done in the ED, he does have Q waves and borderline right bundle branch block.  Overall, uncertain exactly what caused his initial episode, could be hypoglycemia or occult cardiac etiology though this seems less likely.  He does have history of ADHD and previous panic/avoidant behavior and think this is the most likely explanation for his recurrent symptoms.  Can consider cardiac monitor once his insurance situation is  solidified and also placed standing labs he can get at that point.  In the meantime, we will start SSRI and give a short course of Ativan if he has a severe panic attack.  Provided instructions on when to take and discussed how he should only use Ativan if his panic attack is very severe and he understood.  Can try to wean off in the future if his symptoms improved.  Can titrate SSRI at next visit.  Healthcare maintenance CBC, BMP, A1c, lipid profile today.  Can address remaining screening measures at next office visit.   Patient discussed with Dr. Philipp Ovens

## 2022-08-13 NOTE — Assessment & Plan Note (Signed)
CBC, BMP, A1c, lipid profile today.  Can address remaining screening measures at next office visit.

## 2022-08-14 NOTE — Progress Notes (Signed)
Internal Medicine Clinic Attending  Case discussed with Dr. Sridharan  At the time of the visit.  We reviewed the resident's history and exam and pertinent patient test results.  I agree with the assessment, diagnosis, and plan of care documented in the resident's note.  

## 2022-09-02 ENCOUNTER — Telehealth: Payer: Self-pay

## 2022-09-02 ENCOUNTER — Other Ambulatory Visit: Payer: Self-pay

## 2022-09-02 NOTE — Telephone Encounter (Signed)
ERROR

## 2022-09-03 MED ORDER — LORAZEPAM 0.5 MG PO TABS: 0.5000 mg | ORAL_TABLET | Freq: Three times a day (TID) | ORAL | 0 refills | Status: DC | PRN

## 2022-09-09 ENCOUNTER — Other Ambulatory Visit: Payer: BC Managed Care – PPO

## 2022-12-16 NOTE — Progress Notes (Deleted)
   CC: ***  HPI:  [name] is a 37 y.o. male with past medical history of anxiety that presents for ***.    Allergies as of 12/16/2022       Reactions   Penicillins Other (See Comments)   Did it involve swelling of the face/tongue/throat, SOB, or low BP? No Did it involve sudden or severe rash/hives, skin peeling, or any reaction on the inside of your mouth or nose? No Did you need to seek medical attention at a hospital or doctor's office? No When did it last happen?       If all above answers are "NO", may proceed with cephalosporin use.        Medication List        Accurate as of Dec 16, 2022  7:28 AM. If you have any questions, ask your nurse or doctor.          LORazepam 0.5 MG tablet Commonly known as: Ativan Take 1 tablet (0.5 mg total) by mouth every 8 (eight) hours as needed for anxiety.   sertraline 25 MG tablet Commonly known as: Zoloft Take 1 tablet (25 mg total) by mouth daily.         Past Medical History:  Diagnosis Date   ADHD    Review of Systems:  per HPI.   Physical Exam: *** There were no vitals filed for this visit. *** Constitutional: Well-developed, well-nourished, appears comfortable  HENT: Normocephalic and atraumatic.  Eyes: EOM are normal. PERRL.  Neck: Normal range of motion.  Cardiovascular: Regular rate, regular rhythm. No murmurs, rubs, or gallops. Normal radial and PT pulses bilaterally. No LE edema.  Pulmonary: Normal respiratory effort. No wheezes, rales, or rhonchi.   Abdominal: Soft. Non-distended. No tenderness. Normal bowel sounds.  Musculoskeletal: Normal range of motion.     Neurological: Alert and oriented to person, place, and time. Non-focal. Skin: warm and dry.    Assessment & Plan:   Anxiety Patient has a history of anxiety. Current medications include sertraline 25 MG daily and LORazepam 0.5 MG q8 hours PRN. Patient reports that they are *** compliant with this medication. Patient denies  worsening anxiety, SOB, chest pain***.    Plan: - Continue sertraline 25 MG daily and LORazepam 0.5 MG q8 hours PRN***  2. Health screening Last seen in clinic on 1/10 w/ plan to obtain CBC, BMP, A1c, lipid profile. Labs have not yet been collected.   Plan: - CBC, BMP, A1c, lipid profile today***  3.  - Current medications include  Plan: - Continue   4. Health Screening: ***HIV, Hep C, tdap -  - Medication refill? ***     See Encounters Tab for problem based charting.  No problem-specific Assessment & Plan notes found for this encounter.    Patient seen with Dr. Amadeo Garnet

## 2022-12-30 ENCOUNTER — Other Ambulatory Visit: Payer: Self-pay

## 2022-12-31 MED ORDER — LORAZEPAM 0.5 MG PO TABS: 0.5000 mg | ORAL_TABLET | Freq: Three times a day (TID) | ORAL | 0 refills | Status: DC | PRN

## 2023-01-05 ENCOUNTER — Encounter: Payer: Self-pay | Admitting: *Deleted

## 2023-06-15 DIAGNOSIS — T1501XA Foreign body in cornea, right eye, initial encounter: Secondary | ICD-10-CM | POA: Diagnosis not present

## 2023-06-15 DIAGNOSIS — S0501XA Injury of conjunctiva and corneal abrasion without foreign body, right eye, initial encounter: Secondary | ICD-10-CM | POA: Diagnosis not present

## 2023-06-15 DIAGNOSIS — R519 Headache, unspecified: Secondary | ICD-10-CM | POA: Diagnosis not present

## 2023-06-15 DIAGNOSIS — F0781 Postconcussional syndrome: Secondary | ICD-10-CM | POA: Diagnosis not present

## 2023-06-17 DIAGNOSIS — S0501XD Injury of conjunctiva and corneal abrasion without foreign body, right eye, subsequent encounter: Secondary | ICD-10-CM | POA: Diagnosis not present

## 2023-06-23 DIAGNOSIS — S0501XD Injury of conjunctiva and corneal abrasion without foreign body, right eye, subsequent encounter: Secondary | ICD-10-CM | POA: Diagnosis not present

## 2023-09-27 ENCOUNTER — Other Ambulatory Visit: Payer: Self-pay

## 2023-09-29 MED ORDER — LORAZEPAM 0.5 MG PO TABS: 0.5000 mg | ORAL_TABLET | Freq: Three times a day (TID) | ORAL | 0 refills | Status: DC | PRN

## 2023-10-06 ENCOUNTER — Encounter: Payer: BC Managed Care – PPO | Admitting: Student

## 2023-10-08 ENCOUNTER — Telehealth: Payer: Self-pay

## 2023-10-08 NOTE — Telephone Encounter (Signed)
 Copied from CRM [id number]. Topic: Appointments - Appointment Scheduling >> Oct 06, 2023  9:46 AM Epimenio Foot F wrote: Patient/patient representative is calling to schedule an appointment. Attempted to schedule patient appointment, but Epic would not pull up the new patient decision tree to see the new patient schedule. Patient is interested in becoming a new patient. Please follow up with patient to get him scheduled.

## 2023-10-12 ENCOUNTER — Encounter: Payer: Self-pay | Admitting: Student

## 2023-10-12 ENCOUNTER — Ambulatory Visit: Payer: Self-pay | Admitting: Student

## 2023-10-12 VITALS — BP 124/78 | HR 88 | Temp 97.7°F | Ht 71.0 in | Wt 164.2 lb

## 2023-10-12 DIAGNOSIS — F410 Panic disorder [episodic paroxysmal anxiety] without agoraphobia: Secondary | ICD-10-CM | POA: Insufficient documentation

## 2023-10-12 DIAGNOSIS — H9311 Tinnitus, right ear: Secondary | ICD-10-CM | POA: Diagnosis not present

## 2023-10-12 DIAGNOSIS — H9319 Tinnitus, unspecified ear: Secondary | ICD-10-CM | POA: Insufficient documentation

## 2023-10-12 DIAGNOSIS — F419 Anxiety disorder, unspecified: Secondary | ICD-10-CM | POA: Diagnosis not present

## 2023-10-12 MED ORDER — ESCITALOPRAM OXALATE 10 MG PO TABS: 5.0000 mg | ORAL_TABLET | Freq: Every day | ORAL | 2 refills | Status: DC
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
## 2023-10-12 NOTE — Assessment & Plan Note (Addendum)
 Mr. Steven Berry reports tinnitus of the right ear ongoing since the end of 2023.  It is always present although it fluctuates.  He does not report hearing loss, numbness, pain, ear fullness, headaches, or issues with the L ear.  He does have balance changes: mild unsteadiness that comes and goes.  Worsening of the tinnitus is associated with worsening of the balance changes.  When tinnitus and balance are most severely impaired, he has mild vision changes, blurriness, cross eyed.  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 Ear exam is unrevealing. -Given the prolonged course of this symptom and the associated disequilibrium, I most suspect Mnire's disease or a vestibular neuroma. -Will collect formal audiology testing.  If there is a hearing deficiency, I would suspect neuroma and proceed with imaging.  If there is not, I would favor Mnire's and refer to ENT. -Given the timing, I question if this is related to his anxiety, although it would be very atypical as will evaluate as if a separate issue.
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
## 2023-10-12 NOTE — Patient Instructions (Addendum)
 Steven Berry,  It was a pleasure seeing you today.  Let's try a few things.  Please try a medicine called "lexapro." It is a common medicine called an antidepressant (that also works for anxiety, like lorazepam) and it may help smooth out your anxiety. As this starts, please do your best to reduce the amount of lorazepam you take. Lexapro is not an "as needed" medicine, instead it is a medicine you should take every day.  Please consider something called cognitive behavioral therapy. Your symptoms sound very distressing and therapy has proven to be as effective, or even more effective, than medicine in certain cases.  Regarding your ear, we need to do further tests. The first step is a formal hearing assessment. Please expect a call to set that up.  Please return in 1 month.  Best regards, Dr Ninfa Meeker

## 2023-10-13 ENCOUNTER — Telehealth: Payer: Self-pay | Admitting: *Deleted

## 2023-10-13 NOTE — Progress Notes (Signed)
 Complex Care Management Note Care Guide Note  10/13/2023 Name: Steven Berry MRN: [id number] DOB: 05-19-1986   Complex Care Management Outreach Attempts: An unsuccessful telephone outreach was attempted today to offer the patient information about available complex care management services.  Follow Up Plan:  Additional outreach attempts will be made to offer the patient complex care management information and services.   Encounter Outcome:  No Answer  Gwenevere Ghazi  [facility]  [facility], [facility] Guide  Direct Dial: [phone]  Fax [phone]

## 2023-10-13 NOTE — Progress Notes (Signed)
 Complex Care Management Note  Care Guide Note 10/13/2023 Name: CHAIS FEHRINGER MRN: [id number] DOB: 02-27-1986  Vilinda Flake is a 38 y.o. year old male who sees Katheran James, [place] for primary care. I reached out to Vilinda Flake by phone today to offer complex care management services.  Mr. Remmel was given information about Complex Care Management services today including:   The Complex Care Management services include support from the care team which includes your Nurse Care Manager, Clinical Social Worker, or Pharmacist.  The Complex Care Management team is here to help remove barriers to the health concerns and goals most important to you. Complex Care Management services are voluntary, and the patient may decline or stop services at any time by request to their care team member.   Complex Care Management Consent Status: Patient agreed to services and verbal consent obtained.   Follow up plan:  Telephone appointment with complex care management team member scheduled for:  3/13  Encounter Outcome:  Patient Scheduled Gwenevere Ghazi  [facility]  [facility], [facility] Guide  Direct Dial: [phone]  Fax [phone]

## 2023-10-14 ENCOUNTER — Encounter: Payer: Self-pay | Admitting: Licensed Clinical Social Worker

## 2023-10-14 ENCOUNTER — Ambulatory Visit: Payer: Self-pay

## 2023-10-14 NOTE — Patient Outreach (Signed)
 Care Coordination   Initial Visit Note   10/14/2023 Name: CANDY ZIEGLER MRN: [id number] DOB: 07/27/1986  Vilinda Flake is a 38 y.o. year old male who sees Katheran James, [place] for primary care. I spoke with  Vilinda Flake by phone today.  What matters to the patients health and wellness today?  I spoke with Mr. Monestime today, who describes himself as a homebody. He lives with his wife and daughter and expresses a strong dislike for large crowds and public outings. He only interacts with people he knows and reports having lost interest in hobbies or activities that once made him happy. He is experiencing fear and heightened anxiety and primarily travels between work and home.   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 We discussed the option of speaking with a counselor, but he indicated that he would prefer to do so either over the phone or via teleconference. I suggested he check with his insurance provider to understand his benefits for counseling services. I plan to follow up with him next week and will also consult with one of the Licensed Clinical Social Workers I work with to determine the best way to proceed.      Goals Addressed             This Visit's Progress    Patient Stated- he has anxiety and panic attacks       Care Coordination Interventions: Evaluation of current treatment plan related to patient's adherence to plan as established by provider Collaborated with Jenel Lucks regarding Couseling  Active listening / Reflection utilized  Emotional Support Provided Problem Solving /Task Center strategies reviewed Call insurance company to find out your benefits for counseling         SDOH assessments and interventions completed:  Yes  SDOH Interventions Today    Flowsheet Row Most Recent Value  SDOH Interventions   Food Insecurity Interventions Intervention Not  Indicated  Housing Interventions Intervention Not Indicated  Transportation Interventions Intervention Not Indicated  Utilities Interventions Intervention Not Indicated        Care Coordination Interventions:  Yes, provided   . Interventions Today    Flowsheet Row Most Recent Value  Chronic Disease   Chronic disease during today's visit Other  [Anxiey]  General Interventions   General Interventions Discussed/Reviewed General Interventions Discussed  Mental Health Interventions   Mental Health Discussed/Reviewed Anxiety  Pharmacy Interventions   Pharmacy Dicussed/Reviewed Pharmacy Topics Discussed  Safety Interventions   Safety Discussed/Reviewed Safety Discussed        Follow up plan: Follow up call scheduled for 10/20/23  915 am    Encounter Outcome:  Patient Visit Completed   Juanell Fairly RN, BSN, [facility]  [facility], [facility]  Care Coordinator Phone: [phone]

## 2023-10-14 NOTE — Patient Instructions (Signed)
 Visit Information  Thank you for taking time to visit with me today. Please don't hesitate to contact me if I can be of assistance to you.   Following are the goals we discussed today:   Goals Addressed             This Visit's Progress    Patient Stated- he has anxiety and panic attacks       Care Coordination Interventions: Evaluation of current treatment plan related to patient's adherence to plan as established by provider Collaborated with Jenel Lucks regarding Couseling  Active listening / Reflection utilized  Emotional Support Provided Problem Solving /Task Center strategies reviewed Call insurance company to find out your benefits for counseling         Our next appointment is by telephone on 10/20/23 at 915 am  Please call the care guide team at [phone] if you need to cancel or reschedule your appointment.   If you are experiencing a Mental Health or Behavioral Health Crisis or need someone to talk to, please call 1-800-273-TALK (toll free, 24 hour hotline)  Patient verbalizes understanding of instructions and care plan provided today and agrees to view in MyChart. Active MyChart status and patient understanding of how to access instructions and care plan via MyChart confirmed with patient.     Juanell Fairly RN, BSN, [facility]  [facility], [facility]  Care Coordinator Phone: [phone]

## 2023-10-15 NOTE — Patient Outreach (Signed)
 Care Coordination   Collaboration  Visit Note   10/14/2023 Name: ILDEFONSO KEANEY MRN: [id number] DOB: [birth year]  Vilinda Flake is a 38 y.o. year old male who sees Katheran James, [place] for primary care. I  spoke with RNCM  What matters to the patients health and wellness today?  LCSW did not engage patient during this encounter     SDOH assessments and interventions completed:  No     Care Coordination Interventions:  Yes, provided  Interventions Today    Flowsheet Row Most Recent Value  Chronic Disease   Chronic disease during today's visit Other  [Anxiety]  General Interventions   General Interventions Discussed/Reviewed Communication with  Communication with RN  [LCSW collaborated with [facility] regarding patient care needs and/or barriers.]       Follow up plan:  RNCM will continue to follow patient and will schedule him with LCSW, if needed    Encounter Outcome:  Patient Visit Completed   Jenel Lucks, LCSW [place]  [facility], [facility] Clinical Social Worker Direct Dial: [phone]  Fax: [phone] Website: [email or web address] 5:36 PM

## 2023-10-18 DIAGNOSIS — F419 Anxiety disorder, unspecified: Secondary | ICD-10-CM | POA: Diagnosis not present

## 2023-10-18 DIAGNOSIS — T7840XA Allergy, unspecified, initial encounter: Secondary | ICD-10-CM | POA: Diagnosis not present

## 2023-10-20 ENCOUNTER — Telehealth: Payer: Self-pay

## 2023-10-20 NOTE — Patient Outreach (Signed)
 Care Coordination   10/20/2023 Name: AZION CENTRELLA MRN: [id number] DOB: 1986/06/02   Care Coordination Outreach Attempts:  An unsuccessful telephone outreach was attempted today to offer the patient information about available complex care management services.  Follow Up Plan:  Additional outreach attempts will be made to offer the patient complex care management information and services.   Encounter Outcome:  No Answer   Care Coordination Interventions:  No, not indicated    Lonzo Candy, BSN, [facility] [place]  [facility], [facility]  Care Coordinator Phone: [phone]

## 2023-10-20 NOTE — Progress Notes (Signed)
 Internal Medicine Clinic Attending  Case discussed with the resident at the time of the visit.  We reviewed the resident's history and exam and pertinent patient test results.  I agree with the assessment, diagnosis, and plan of care documented in the resident's note.

## 2023-10-28 ENCOUNTER — Telehealth: Payer: Self-pay | Admitting: Audiology

## 2023-11-03 ENCOUNTER — Other Ambulatory Visit: Payer: Self-pay | Admitting: Student

## 2023-11-03 DIAGNOSIS — F419 Anxiety disorder, unspecified: Secondary | ICD-10-CM

## 2023-11-19 ENCOUNTER — Other Ambulatory Visit: Payer: Self-pay | Admitting: Student

## 2023-11-19 DIAGNOSIS — F419 Anxiety disorder, unspecified: Secondary | ICD-10-CM

## 2023-11-23 ENCOUNTER — Telehealth: Payer: Self-pay

## 2023-11-23 NOTE — Telephone Encounter (Signed)
 Nils Baseman please reschedule with Traci   Thank you   Barnie Bora  [facility]  [facility], [facility] Guide  Direct Dial: [phone]  Fax [phone]

## 2023-11-23 NOTE — Patient Outreach (Signed)
 Complex Care Management Care Guide Note  11/23/2023 Name: Steven Berry MRN: [id number] DOB: [birth year]  Steven Berry is a 38 y.o. year old male who is a primary care patient of Carleen Chary, DO and is actively engaged with the care management team. I reached out to Steven Berry by phone today to assist with re-scheduling  with the RN Case Manager.  Follow up plan: Unsuccessful telephone outreach attempt made. A HIPAA compliant phone message was left for the patient providing contact information and requesting a return call.  Carter Clare, BSW, CDP [place]  [facility] - [facility] Manager Population Health Direct Dial: [phone]  Fax: [phone]

## 2023-11-24 ENCOUNTER — Ambulatory Visit: Attending: Audiologist | Admitting: Audiologist

## 2023-11-24 ENCOUNTER — Encounter: Admitting: Internal Medicine

## 2023-11-24 NOTE — Progress Notes (Deleted)
 HCM HIV HCV Pneumonia Tetanus  Anxiety OP regimen is lexapro  10 mg daily, ativan  0.5 mg q8h PRN anxiety. He is not taking lexapro  due to onset of symptoms of burning sensation in his stomach, dizziness, and a "tickle in his throat" within 5 days of starting the medication; he was prescribed hydroxyzine  by an urgen care and advised to stop the medication. GAD-7 03/11 16, today is ***. He was referred to cognitive behavioral therapy which he has*** been going to. Shared decision making during last OV resulted in a plan to decrease ativan  use from every morning to strictly PRN; he reports he is taking this ***. PDMP reviewed, last fill ***. Plan:

## 2023-12-01 ENCOUNTER — Ambulatory Visit: Admitting: Family Medicine

## 2023-12-02 ENCOUNTER — Telehealth: Payer: Self-pay | Admitting: *Deleted

## 2023-12-02 NOTE — Progress Notes (Signed)
 Complex Care Management Care Guide Note  12/02/2023 Name: Steven Berry MRN: [id number] DOB: 07-05-1986  [name] is a 38 y.o. year old male who is a primary care patient of Carleen Chary, DO and is actively engaged with the care management team. I reached out to [name] by phone today to assist with re-scheduling  with the RN Case Manager.  Follow up plan: Unsuccessful telephone outreach attempt made. A HIPAA compliant phone message was left for the patient providing contact information and requesting a return call. No further outreach attempts will be made at this time. We have been unable to contact the patient to reschedule for complex care management services.   Barnie Bora  [facility]  [facility], [facility] Guide  Direct Dial: [phone]  Fax [phone]

## 2023-12-03 ENCOUNTER — Ambulatory Visit: Admitting: Family Medicine

## 2023-12-08 ENCOUNTER — Telehealth: Payer: Self-pay | Admitting: *Deleted

## 2023-12-08 ENCOUNTER — Other Ambulatory Visit: Payer: Self-pay | Admitting: Student

## 2023-12-08 DIAGNOSIS — F419 Anxiety disorder, unspecified: Secondary | ICD-10-CM

## 2023-12-08 MED ORDER — HYDROXYZINE HCL 50 MG PO TABS: 50.0000 mg | ORAL_TABLET | Freq: Three times a day (TID) | ORAL | 0 refills | Status: DC | PRN

## 2023-12-08 NOTE — Telephone Encounter (Signed)
 Copied from CRM [id number]. Topic: Clinical - Medication Question >> Dec 08, 2023 12:06 PM Karole Pacer C wrote: Reason for CRM: Patient states the following medication: LORazepam  (ATIVAN ) 0.5 mg tablet was giving him hot flashes, tingling in arms and face. Patient was seen by provider Katharina Palin in urgent care and prescribed hydrOXYzine  (ATARAX ) 25 mg tablet. Patient says the med prescribed by Dr. Pauleen Borne helped and he would like to know how he could go about getting a refill for hydrOXYzine  (ATARAX ) 25 mg tablet.

## 2023-12-08 NOTE — Telephone Encounter (Signed)
 Pt called / informed of 30 day supply of Hydroxyzine ; also informed he needs to schedule a f/u appt. Stated he will wait to see how the med works (about 15 days) and call back to schedule an appt.

## 2023-12-30 ENCOUNTER — Other Ambulatory Visit: Payer: Self-pay | Admitting: Student

## 2024-03-24 DIAGNOSIS — R079 Chest pain, unspecified: Secondary | ICD-10-CM | POA: Diagnosis not present

## 2024-03-24 DIAGNOSIS — F419 Anxiety disorder, unspecified: Secondary | ICD-10-CM | POA: Diagnosis not present

## 2024-03-27 ENCOUNTER — Other Ambulatory Visit: Payer: Self-pay | Admitting: Student

## 2024-03-27 ENCOUNTER — Ambulatory Visit: Payer: Self-pay

## 2024-03-27 NOTE — Telephone Encounter (Signed)
 I called pt who stated the dizziness comes and goes but everyday. Stated he was told it was anxiety; prescribed Lorazepam  and Hydroxyzine . But he thinks maybe it's vertigo. He will discuss this with the doctor tomorrow.  Pt has an appt tomorrow 8/26 with Dr Harrie.

## 2024-03-27 NOTE — Telephone Encounter (Signed)
 FYI Only or Action Required?: Action required by provider: medication refill request.  Patient was last seen in primary care on 10/12/2023 by Harrie Bruckner, DO.  Called Nurse Triage reporting No chief complaint on file..  Symptoms began today.  Interventions attempted: Nothing.  Symptoms are: gradually worsening.  Triage Disposition: No disposition on file.  Patient/caregiver understands and will follow disposition?:

## 2024-03-27 NOTE — Telephone Encounter (Unsigned)
 Copied from CRM #[id number]. Topic: Clinical - Medication Refill >> Mar 27, 2024  9:15 AM Alfonso ORN wrote: Medication: LORazepam  (ATIVAN ) 0.5 MG tablet  Has the patient contacted their pharmacy? no (Agent: If no, request that the patient contact the pharmacy for the refill. If patient does not wish to contact the pharmacy document the reason why and proceed with request.) (Agent: If yes, when and what did the pharmacy advise?)  This is the patient's preferred pharmacy:  [facility] [address] [place] - [address] [place] [address] Phone: [phone] Fax: [phone]  Is this the correct pharmacy for this prescription? Yes If no, delete pharmacy and type the correct one.   Has the prescription been filled recently? No  Is the patient out of the medication? No  Has the patient been seen for an appointment in the last year OR does the patient have an upcoming appointment? Yes  Can we respond through MyChart? Yes  Agent: Please be advised that Rx refills may take up to 3 business days. We ask that you follow-up with your pharmacy.

## 2024-03-27 NOTE — Telephone Encounter (Signed)
 FYI Only or Action Required?: FYI only for provider.  Patient was last seen in primary care on 10/12/2023 by Harrie Bruckner, DO.  Called Nurse Triage reporting Dizziness.  Symptoms began several years ago.  Interventions attempted: Nothing.  Symptoms are: gradually worsening.  Triage Disposition: See Physician Within 24 Hours  Patient/caregiver understands and will follow disposition?: Yes  Copied from CRM #[id number]. Topic: Clinical - Red Word Triage >> Mar 27, 2024  9:20 AM Alfonso ORN wrote: Red Word that prompted transfer to Nurse Triage: patient been having dizzy spells  Pt. Call back # [phone] Reason for Disposition  [1] MODERATE dizziness (e.g., interferes with normal activities) AND [2] has NOT been evaluated by doctor (or NP/PA) for this  (Exception: Dizziness caused by heat exposure, sudden standing, or poor fluid intake.)  Answer Assessment - Initial Assessment Questions 1. DESCRIPTION: Describe your dizziness.     States can be doing something and if he turns his eyes he feels woozy 2. LIGHTHEADED: Do you feel lightheaded? (e.g., somewhat faint, woozy, weak upon standing)     woozy 3. VERTIGO: Do you feel like either you or the room is spinning or tilting? (i.e., vertigo)     denies 4. SEVERITY: How bad is it?  Do you feel like you are going to faint? Can you stand and walk?     lightheaded 5. ONSET:  When did the dizziness begin?     2023 6. AGGRAVATING FACTORS: Does anything make it worse? (e.g., standing, change in head position)     Moving eyes, and change in head position 7. HEART RATE: Can you tell me your heart rate? How many beats in 15 seconds?  (Note: Not all patients can do this.)       Feels like it goes up 8. CAUSE: What do you think is causing the dizziness? (e.g., decreased fluids or food, diarrhea, emotional distress, heat exposure, new medicine, sudden standing, vomiting; unknown)     Heat exposure 9. RECURRENT SYMPTOM:  Have you had dizziness before? If Yes, ask: When was the last time? What happened that time?     denies 10. OTHER SYMPTOMS: Do you have any other symptoms? (e.g., fever, chest pain, vomiting, diarrhea, bleeding)       Hot flashes 11. PREGNANCY: Is there any chance you are pregnant? When was your last menstrual period?       na  Protocols used: Dizziness - Lightheadedness-A-AH

## 2024-03-28 ENCOUNTER — Other Ambulatory Visit: Payer: Self-pay | Admitting: Student

## 2024-03-28 ENCOUNTER — Ambulatory Visit: Payer: Self-pay | Admitting: Student

## 2024-03-28 MED ORDER — LORAZEPAM 0.5 MG PO TABS: 0.5000 mg | ORAL_TABLET | Freq: Three times a day (TID) | ORAL | 0 refills | Status: AC | PRN

## 2024-04-05 ENCOUNTER — Ambulatory Visit: Admitting: Student

## 2024-04-05 VITALS — BP 130/83 | HR 81 | Temp 98.1°F | Ht 71.0 in | Wt 165.6 lb

## 2024-04-05 DIAGNOSIS — Z23 Encounter for immunization: Secondary | ICD-10-CM | POA: Diagnosis not present

## 2024-04-05 DIAGNOSIS — H9311 Tinnitus, right ear: Secondary | ICD-10-CM

## 2024-04-05 DIAGNOSIS — F410 Panic disorder [episodic paroxysmal anxiety] without agoraphobia: Secondary | ICD-10-CM | POA: Diagnosis not present

## 2024-04-05 MED ORDER — BUSPIRONE HCL 5 MG PO TABS: 5.0000 mg | ORAL_TABLET | Freq: Three times a day (TID) | ORAL | 2 refills | Status: DC

## 2024-04-05 NOTE — Assessment & Plan Note (Signed)
 I evaluated him for this problem back in March.  The history is panic attacks of increasing frequency since December 2023.  Now about every 3 to 4 days.  Worse with driving and traffic but not exclusive to this context.  No major trauma in his history, no nightmares.  Very of significant impact on his daily life.  We tried Lexapro  at his last visit but he did not tolerate it.  He has also failed Zoloft  in the past.  Constituting 2 failed SSRIs.  At this point I believe he needs to see a psychiatrist.  For now I will attempt treatment with BuSpar , anxiolytic, which I hope will help him.  I will also continue his lorazepam  tablet.  He is very motivated not to require use of the lorazepam  long-term.  For now I believe it is appropriate, it almost completely relieves his symptoms.  - Buspar  5 3 times daily - Refer to psychiatry - Follow-up in 1 month because restarting a new medicine

## 2024-04-05 NOTE — Assessment & Plan Note (Signed)
 I evaluated this issue back in March as well.  Unfortunately is persistent.  The right ear is the problem, not the left ear.  The right ear is normal on exam. - Will refer for audiology testing.  Profession is Curator.

## 2024-04-06 NOTE — Progress Notes (Signed)
   CC: Panic attacks  HPI:  [name] is a 38 y.o. male with a PMH stated below who presents today for evaluation of ongoing panic attacks.  These occur every 3 to 4 days.  They are only relieved with Ativan  and he admits he does not want to require use of this medicine long-term.  I evaluated him for this issue back in March.  Please see problem based assessment and plan for additional details.  Past Medical History:  Diagnosis Date   ADHD     Review of Systems: ROS negative except for what is noted on the assessment and plan.  Vitals:   04/05/24 1607  BP: 130/83  Pulse: 81  Temp: 98.1 F (36.7 C)  TempSrc: Oral  SpO2: 96%  Weight: 165 lb 9.6 oz (75.1 kg)  Height: 5' 11 (1.803 m)   Physical Exam: Constitutional: well-appearing man in no acute distress Eyes: conjunctiva non-erythematous Cardiovascular: regular rate and rhythm, no m/r/g Pulmonary/Chest: normal work of breathing on room air, lungs clear to auscultation bilaterally MSK: normal bulk and tone Skin: warm and dry Psych: normal mood and behavior  Assessment & Plan:   Patient discussed with Dr. Karna  Panic disorder I evaluated him for this problem back in March.  The history is panic attacks of increasing frequency since December 2023.  Now about every 3 to 4 days.  Worse with driving and traffic but not exclusive to this context.  No major trauma in his history, no nightmares.  Significant impact on his daily life.  We tried Lexapro  at his last visit but he did not tolerate it.  He has also failed Zoloft  in the past.  Constituting 2 failed SSRIs.  At this point I believe he needs to see a psychiatrist.  For now I will attempt treatment with BuSpar , anxiolytic, which I hope will help him.  I will also continue his lorazepam  tablet.  He is very motivated not to require use of the lorazepam  long-term, but for now it remains appropriate as it his only reliable relief.  - Start Buspar  5mg  TID - Refer to  psychiatry - Follow-up in 1 month to evaluate impact of new medicine  Tinnitus I evaluated this issue back in March as well.  Unfortunately is persistent.  The right ear is the problem, not the left ear.  The right ear is normal on exam.  Referred him to audiologic testing but I do not think that appointment was ever made.  At last visit Mnire's disease will limit differential but I feel strong about that now, there is no report of ear fullness or dizziness outside of the context of his panic attacks.  If there is a hearing deficiency could consider imaging to rule out neuroma. - Will refer for audiology testing.  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  New medicine is BuSpar  5 TID.  Please see if he was able to get established with a psychiatrist and get his hearing checked.  Lonni Africa, D.O. [facility], PGY-2 Phone: [phone] Date 04/06/2024 Time 10:51 AM

## 2024-04-06 NOTE — Progress Notes (Signed)
 Internal Medicine Clinic Attending  Case discussed with the resident at the time of the visit.  We reviewed the resident's history and exam and pertinent patient test results.  I agree with the assessment, diagnosis, and plan of care documented in the resident's note.

## 2024-04-27 ENCOUNTER — Other Ambulatory Visit: Payer: Self-pay | Admitting: Student

## 2024-04-27 DIAGNOSIS — F410 Panic disorder [episodic paroxysmal anxiety] without agoraphobia: Secondary | ICD-10-CM

## 2024-05-26 ENCOUNTER — Other Ambulatory Visit: Payer: Self-pay

## 2024-05-26 DIAGNOSIS — F410 Panic disorder [episodic paroxysmal anxiety] without agoraphobia: Secondary | ICD-10-CM

## 2024-05-29 ENCOUNTER — Ambulatory Visit: Attending: Audiologist | Admitting: Audiologist
